# Patient Record
Sex: Male | Born: 1965 | Race: Black or African American | Hispanic: No | Marital: Married | Smoking: Current every day smoker
Health system: Southern US, Community
[De-identification: ages and names within clinical notes are randomized; demographics above are authoritative.]

## PROBLEM LIST (undated history)

## (undated) DIAGNOSIS — M9250 Juvenile osteochondrosis of tibia and fibula, unspecified leg: Secondary | ICD-10-CM

## (undated) DIAGNOSIS — G43909 Migraine, unspecified, not intractable, without status migrainosus: Secondary | ICD-10-CM

## (undated) DIAGNOSIS — M92529 Juvenile osteochondrosis of tibia tubercle, unspecified leg: Secondary | ICD-10-CM

## (undated) DIAGNOSIS — J189 Pneumonia, unspecified organism: Secondary | ICD-10-CM

## (undated) DIAGNOSIS — A6920 Lyme disease, unspecified: Secondary | ICD-10-CM

## (undated) DIAGNOSIS — Z8659 Personal history of other mental and behavioral disorders: Secondary | ICD-10-CM

## (undated) HISTORY — PX: NO PAST SURGERIES: SHX2092

---

## 1993-07-01 DIAGNOSIS — J189 Pneumonia, unspecified organism: Secondary | ICD-10-CM

## 1993-07-01 HISTORY — DX: Pneumonia, unspecified organism: J18.9

## 2000-10-02 ENCOUNTER — Emergency Department (HOSPITAL_COMMUNITY): Admission: EM | Admit: 2000-10-02 | Discharge: 2000-10-02 | Payer: Self-pay | Admitting: *Deleted

## 2001-08-13 ENCOUNTER — Emergency Department (HOSPITAL_COMMUNITY): Admission: EM | Admit: 2001-08-13 | Discharge: 2001-08-13 | Payer: Self-pay | Admitting: *Deleted

## 2001-10-20 ENCOUNTER — Encounter: Payer: Self-pay | Admitting: Emergency Medicine

## 2001-10-20 ENCOUNTER — Emergency Department (HOSPITAL_COMMUNITY): Admission: EM | Admit: 2001-10-20 | Discharge: 2001-10-20 | Payer: Self-pay | Admitting: *Deleted

## 2003-12-28 ENCOUNTER — Emergency Department (HOSPITAL_COMMUNITY): Admission: EM | Admit: 2003-12-28 | Discharge: 2003-12-28 | Payer: Self-pay | Admitting: Emergency Medicine

## 2010-06-15 ENCOUNTER — Ambulatory Visit: Payer: Self-pay | Admitting: Family Medicine

## 2010-06-15 DIAGNOSIS — F17200 Nicotine dependence, unspecified, uncomplicated: Secondary | ICD-10-CM | POA: Insufficient documentation

## 2010-08-02 NOTE — Assessment & Plan Note (Signed)
Summary: UNCLOG EARS/EVM   Vital Signs:  Patient Profile:   45 Years Old Male CC:      Left Ear Ache, Possible wax bulid up. Height:     72 inches Weight:      167 pounds BMI:     22.73 O2 Sat:      100 % O2 treatment:    Room Air Temp:     98.0 degrees F oral Pulse rate:   76 / minute Pulse rhythm:   regular Resp:     18 per minute BP sitting:   130 / 85  (right arm)  Pt. in pain?   yes    Location:   head    Intensity:   2    Type:       aching  Vitals Entered By: Levonne Spiller EMT-P (June 15, 2010 3:00 PM)              Is Patient Diabetic? No  Does patient need assistance? Functional Status Self care Ambulation Normal Comments Pt. is a smoker. 1 half pack per day.      Current Allergies: No known allergies History of Present Illness History from: patient Reason for visit: see chief complaint Chief Complaint: Left Ear Ache, Possible wax bulid up. History of Present Illness: The patient came in today because he says that he has to have his ears cleaned out every few years because of cerumen impaction.  He says that he has tried to clean his ears at home with no luck.  He is having trouble hearing and relies on being able to hear well.  The patient says that he would like to have his ears cleaned.  He says that he is having some sinus drainage and nasal congestion as well. No fever or chills or nausea or vomiting.   REVIEW OF SYSTEMS Constitutional Symptoms      Denies fever, chills, night sweats, weight loss, weight gain, and fatigue.  Eyes       Denies change in vision, eye pain, eye discharge, glasses, contact lenses, and eye surgery. Ear/Nose/Throat/Mouth       Complains of hearing loss/aids, change in hearing, ear pain, and sinus problems.      Denies ear discharge, dizziness, frequent runny nose, frequent nose bleeds, sore throat, hoarseness, and tooth pain or bleeding.  Respiratory       Denies dry cough, productive cough, wheezing, shortness of breath,  asthma, bronchitis, and emphysema/COPD.  Cardiovascular       Denies murmurs, chest pain, and tires easily with exhertion.    Gastrointestinal       Denies stomach pain, nausea/vomiting, diarrhea, constipation, blood in bowel movements, and indigestion. Genitourniary       Denies painful urination, kidney stones, and loss of urinary control. Neurological       Denies paralysis, seizures, and fainting/blackouts. Musculoskeletal       Denies muscle pain, joint pain, joint stiffness, decreased range of motion, redness, swelling, muscle weakness, and gout.  Skin       Denies bruising, unusual mles/lumps or sores, and hair/skin or nail changes.  Psych       Denies mood changes, temper/anger issues, anxiety/stress, speech problems, depression, and sleep problems.  Past History:  Family History: Last updated: 06/15/2010 Mother - Bells Palsy, Ovarian Cancer Father - Cancer Son has narrow ear canals and has to see a specialist every 4 months.   Social History: Last updated: 06/15/2010 Occupation: Herbalist Married Current Smoker Alcohol  use-no Drug use-no They have a son  Risk Factors: Smoking Status: current (06/15/2010)  Past Medical History: Bilateral narrow ear canals  Past Surgical History: Denies surgical history  Family History: Mother - Bells Palsy, Ovarian Cancer Father - Cancer Son has narrow ear canals and has to see a specialist every 4 months.   Social History: Occupation: Herbalist Married Current Smoker Alcohol use-no Drug use-no They have a sonSmoking Status:  current Drug Use:  no Physical Exam General appearance: well developed, well nourished, no acute distress Head: normocephalic, atraumatic Eyes: conjunctivae and lids normal Pupils: equal, round, reactive to light Ears: excessive cerumen bilateral Nasal: swollen red turbinates with congestion, thick yellow discharge Oral/Pharynx: tongue normal, posterior  pharynx without erythema or exudate Neck: neck supple,  trachea midline, no masses Chest/Lungs: no rales, wheezes, or rhonchi bilateral, breath sounds equal without effort Heart: regular rate and  rhythm, no murmur Abdomen: soft, non-tender without obvious organomegaly Extremities: normal extremities Neurological: grossly intact and non-focal Skin: no obvious rashes or lesions MSE: oriented to time, place, and person Assessment Problems:   New Problems: CIGARETTE SMOKER (ICD-305.1) ACUTE SINUSITIS, UNSPECIFIED (ICD-461.9) CERUMEN IMPACTION, BILATERAL (ICD-380.4)   Patient Education: The risks, benefits and possible side effects were clearly explained and discussed with the patient.  The patient verbalized clear understanding.  The patient was given instructions to return if symptoms don't improve, worsen or new changes develop.  If it is not during clinic hours and the patient cannot get back to this clinic then the patient was told to seek medical care at an available urgent care or emergency department.  The patient verbalized understanding.   Demonstrates willingness to comply.  Plan New Medications/Changes: AMOXICILLIN 500 MG CAPS (AMOXICILLIN) take 1 by mouth three times a day  #30 x 0, 06/15/2010, Clanford Johnson MD FLUTICASONE PROPIONATE 50 MCG/ACT SUSP (FLUTICASONE PROPIONATE) 2 sprays per nostril once daily for sinus and allergies  #1 x 0, 06/15/2010, Standley Dakins MD  Planning Comments:   The patient was counseled and advised to stop using all tobacco products.  Medical assistance was offered and the patient was encouraged to call 1-800-QUIT-NOW to get a smoking cessation coach.    Follow Up: Follow up in 2-3 days if no improvement, Follow up on an as needed basis, Follow up with Primary Physician  The patient and/or caregiver has been counseled thoroughly with regard to medications prescribed including dosage, schedule, interactions, rationale for use, and possible side  effects and they verbalize understanding.  Diagnoses and expected course of recovery discussed and will return if not improved as expected or if the condition worsens. Patient and/or caregiver verbalized understanding.  Prescriptions: AMOXICILLIN 500 MG CAPS (AMOXICILLIN) take 1 by mouth three times a day  #30 x 0   Entered and Authorized by:   Standley Dakins MD   Signed by:   Standley Dakins MD on 06/15/2010   Method used:   Electronically to        [facility] [address] (retail)       [address], [address]       [place], [place]  [address]       Ph: [phone]       Fax: [phone]   RxID:   [id number] FLUTICASONE PROPIONATE 50 MCG/ACT SUSP (FLUTICASONE PROPIONATE) 2 sprays per nostril once daily for sinus and allergies  #1 x 0   Entered and Authorized by:   Standley Dakins  MD   Signed by:   Standley Dakins MD on 06/15/2010   Method used:   Electronically to        [facility] [address] (retail)       [address], [address]       [place], [place]  [address]       Ph: [phone]       Fax: [phone]   RxID:   [id number]   Patient Instructions: 1)  Tobacco is very bad for your health and your loved ones! You Should stop smoking!. 2)  Stop Smoking Tips: Choose a Quit date. Cut down before the Quit date. decide what you will do as a substitute when you feel the urge to smoke(gum,toothpick,exercise). 3)  Take your antibiotic as prescribed until ALL of it is gone, but stop if you develop a rash or swelling and contact our office as soon as possible. 4)  Acute sinusitis symptoms for less than 10 days are not helped by antibiotics.Use warm moist compresses, and over the counter decongestants ( only as directed). Call if no improvement in 5-7 days, sooner if increasing pain, fever, or new symptoms. 5)  The patient was informed that there is no on-call Kaige Whistler or services available at this clinic during  off-hours (when the clinic is closed).  If the patient developed a problem or concern that required immediate attention, the patient was advised to go the the nearest available urgent care or emergency department for medical care.  The patient verbalized understanding.

## 2011-10-29 ENCOUNTER — Emergency Department (HOSPITAL_BASED_OUTPATIENT_CLINIC_OR_DEPARTMENT_OTHER)
Admission: EM | Admit: 2011-10-29 | Discharge: 2011-10-30 | Disposition: A | Discharge status: Home / Self Care | Payer: Self-pay | Attending: Emergency Medicine | Admitting: Emergency Medicine

## 2011-10-29 ENCOUNTER — Emergency Department (INDEPENDENT_AMBULATORY_CARE_PROVIDER_SITE_OTHER): Payer: Self-pay

## 2011-10-29 ENCOUNTER — Encounter (HOSPITAL_BASED_OUTPATIENT_CLINIC_OR_DEPARTMENT_OTHER): Payer: Self-pay | Admitting: Emergency Medicine

## 2011-10-29 DIAGNOSIS — M25476 Effusion, unspecified foot: Secondary | ICD-10-CM | POA: Insufficient documentation

## 2011-10-29 DIAGNOSIS — S82899A Other fracture of unspecified lower leg, initial encounter for closed fracture: Secondary | ICD-10-CM | POA: Insufficient documentation

## 2011-10-29 DIAGNOSIS — Y9239 Other specified sports and athletic area as the place of occurrence of the external cause: Secondary | ICD-10-CM | POA: Insufficient documentation

## 2011-10-29 DIAGNOSIS — X58XXXA Exposure to other specified factors, initial encounter: Secondary | ICD-10-CM

## 2011-10-29 DIAGNOSIS — Z09 Encounter for follow-up examination after completed treatment for conditions other than malignant neoplasm: Secondary | ICD-10-CM

## 2011-10-29 DIAGNOSIS — S82301A Unspecified fracture of lower end of right tibia, initial encounter for closed fracture: Secondary | ICD-10-CM

## 2011-10-29 DIAGNOSIS — S8990XA Unspecified injury of unspecified lower leg, initial encounter: Secondary | ICD-10-CM | POA: Insufficient documentation

## 2011-10-29 DIAGNOSIS — M25579 Pain in unspecified ankle and joints of unspecified foot: Secondary | ICD-10-CM | POA: Insufficient documentation

## 2011-10-29 DIAGNOSIS — W010XXA Fall on same level from slipping, tripping and stumbling without subsequent striking against object, initial encounter: Secondary | ICD-10-CM | POA: Insufficient documentation

## 2011-10-29 DIAGNOSIS — M25473 Effusion, unspecified ankle: Secondary | ICD-10-CM | POA: Insufficient documentation

## 2011-10-29 DIAGNOSIS — Y9364 Activity, baseball: Secondary | ICD-10-CM | POA: Insufficient documentation

## 2011-10-29 MED ORDER — MIDAZOLAM HCL 5 MG/5ML IJ SOLN
INTRAMUSCULAR | Status: AC
  Filled 2011-10-29: qty 5

## 2011-10-29 MED ORDER — MIDAZOLAM HCL 2 MG/2ML IJ SOLN: 4.0000 mg | Freq: Once | INTRAMUSCULAR | Status: DC

## 2011-10-29 MED ORDER — ETOMIDATE 2 MG/ML IV SOLN: 10.0000 mg | Freq: Once | INTRAVENOUS | Status: DC

## 2011-10-29 MED ORDER — OXYCODONE-ACETAMINOPHEN 5-325 MG PO TABS: ORAL_TABLET | ORAL | Status: DC

## 2011-10-29 MED ORDER — MIDAZOLAM HCL 2 MG/2ML IJ SOLN
2.0000 mg | Freq: Once | INTRAMUSCULAR | Status: AC
  Administered 2011-10-29: 2 mg via INTRAVENOUS

## 2011-10-29 MED ORDER — HYDROMORPHONE HCL PF 1 MG/ML IJ SOLN
0.5000 mg | Freq: Once | INTRAMUSCULAR | Status: AC
  Administered 2011-10-29: 0.5 mg via INTRAVENOUS
  Filled 2011-10-29: qty 1

## 2011-10-29 MED ORDER — ETOMIDATE 2 MG/ML IV SOLN
8.0000 mg | Freq: Once | INTRAVENOUS | Status: AC
  Administered 2011-10-29: 8 mg via INTRAVENOUS

## 2011-10-29 MED ORDER — ETOMIDATE 2 MG/ML IV SOLN
INTRAVENOUS | Status: AC
  Filled 2011-10-29: qty 10

## 2011-10-29 MED ORDER — HYDROMORPHONE HCL PF 1 MG/ML IJ SOLN
INTRAMUSCULAR | Status: AC
  Administered 2011-10-29: 1 mg
  Filled 2011-10-29: qty 1

## 2011-10-29 NOTE — ED Notes (Signed)
Pt was placed on [place] 2 lpm for conscious sedation procedure of his right ankle. Pt tolerated well and SPO2 and other vitals remained stable.

## 2011-10-29 NOTE — Discharge Instructions (Signed)
Narcotic and benzodiazepine use may cause drowsiness, slowed breathing or dependence.  Please use with caution and do not drive, operate machinery or watch young children alone while taking them.  Taking combinations of these medications or drinking alcohol will potentiate these effects.    

## 2011-10-29 NOTE — ED Notes (Signed)
inj to left ankle/foot  slidding into base

## 2011-10-29 NOTE — ED Provider Notes (Signed)
History     CSN: [id number]  Arrival date & time 10/29/11  2006   First MD Initiated Contact with Patient 10/29/11 2015      Chief Complaint  Patient presents with  . Foot Injury    (Consider location/radiation/quality/duration/timing/severity/associated sxs/prior treatment) HPI Comments: Pt was playing softball and slid into third base with immediate pain and deformity to lower right leg at ankle with obvious deformity.  No laceration or blood or abrasion seen.  Pt denies pain at knee, hip, thigh.  Level 5 caveat due to emergent need for intervention and severe pain.  No SOB, nausea.  Pt last ate yesterday.  No sig PMH.    The history is provided by the patient and a friend.    History reviewed. No pertinent past medical history.  History reviewed. No pertinent past surgical history.  History reviewed. No pertinent family history.  History  Substance Use Topics  . Smoking status: Not on file  . Smokeless tobacco: Not on file  . Alcohol Use: Yes      Review of Systems  Unable to perform ROS: Other    Allergies  Review of patient's allergies indicates no known allergies.  Home Medications   Current Outpatient Rx  Name Route Sig Dispense Refill  . OXYCODONE-ACETAMINOPHEN 5-325 MG PO TABS  1-2 tablets po q 6 hours prn moderate to severe pain 30 tablet 0    BP 100/70  Pulse 74  Temp(Src) 97 F (36.1 C) (Oral)  Resp 20  SpO2 99%  Physical Exam  Nursing note and vitals reviewed. Constitutional: He is oriented to person, place, and time. He appears well-developed and well-nourished.  HENT:  Head: Normocephalic and atraumatic.  Mouth/Throat: Uvula is midline and oropharynx is clear and moist.  Eyes: Pupils are equal, round, and reactive to light.  Neck: Normal range of motion. Neck supple.  Cardiovascular: Normal rate and regular rhythm.   Pulmonary/Chest: Effort normal. He has no wheezes. He has no rales.  Musculoskeletal:       Right ankle: He exhibits  decreased range of motion, swelling and deformity. He exhibits no laceration and normal pulse. No proximal fibula tenderness found.       Compartments are soft  Neurological: He is alert and oriented to person, place, and time. No sensory deficit. GCS eye subscore is 4. GCS verbal subscore is 5. GCS motor subscore is 6.  Skin: Skin is warm, dry and intact. No rash noted. He is not diaphoretic. No pallor.    ED Course  Reduction of fracture Date/Time: 10/29/2011 9:08 PM Performed by: Lear Ng. Authorized by: Lear Ng Consent: Verbal consent obtained. Written consent obtained. Risks and benefits: risks, benefits and alternatives were discussed Consent given by: patient and spouse Patient understanding: patient states understanding of the procedure being performed Site marked: the operative site was not marked Imaging studies: imaging studies available Patient identity confirmed: verbally with patient and arm band Time out: Immediately prior to procedure a "time out" was called to verify the correct patient, procedure, equipment, support staff and site/side marked as required. Patient sedated: yes Sedation type: moderate (conscious) sedation Sedatives: etomidate and midazolam Analgesia: hydromorphone Sedation start date/time: 10/29/2011 8:57 PM Sedation end date/time: 10/29/2011 9:09 PM Vitals: Vital signs were monitored during sedation. Patient tolerance: Patient tolerated the procedure well with no immediate complications. Comments: Reduction of distal right tib fib fracture.  I was responsible for splinting assisted by technician.  Cap refill remains at <2 sec.  SPLINT APPLICATION Date/Time: 10/29/2011 9:11 PM Performed by: Lear Ng. Authorized by: Lear Ng Consent: Verbal consent obtained. Consent given by: patient Patient understanding: patient states understanding of the procedure being performed Patient identity confirmed: arm band Time out:  Immediately prior to procedure a "time out" was called to verify the correct patient, procedure, equipment, support staff and site/side marked as required. Location details: right ankle Post-procedure: The splinted body part was neurovascularly unchanged following the procedure. Patient tolerance: Patient tolerated the procedure well with no immediate complications.   (including critical care time)  Labs Reviewed - No data to display Dg Ankle Right Port  10/29/2011  *RADIOLOGY REPORT*  Clinical Data: Post reduction right ankle fracture.  PORTABLE RIGHT ANKLE - 2 VIEW  Comparison: 10/29/2011  Findings: Interval improved alignment of the complex distal tibia and fibular fractures. There is some residual lateral displacement and angulation. Nondisplaced medial malleolus fracture. Detailed osseous evaluation is obscured by overlying cast artifact.  IMPRESSION: Post reduction distal tibia/fibular fractures.  Improved alignment with mild residual lateral displacement and angulation.  Original Report Authenticated By: Waneta Martins, M.D.   Dg Ankle Right Port  10/29/2011  *RADIOLOGY REPORT*  Clinical Data: Softball injury, now with obvious deformity  PORTABLE RIGHT ANKLE - 2 VIEW  Comparison: None.  Findings:  There is a complete, oblique fracture of the distal tibial and adjacent distal fibular diaphysis with foreshortening of the fracture fragments and angulation, apex anterior and lateral.  The ankle mortise appears preserved on the provided AP radiograph. There is expected adjacent soft tissue swelling.  No definite radiopaque foreign body.  IMPRESSION: Complete, oblique fractures of the distal diaphysis of the tibia and fibula with angulation and foreshortening.  Ankle mortise appears preserved on solitary provided AP radiograph.  Original Report Authenticated By: Waynard Reeds, M.D.     1. Closed fracture of distal end of fibula with tibia, right, initial encounter       MDM  I reviewed  initial film.  Sedation with reduction is recommended to pt who verbally agrees, signs consent as above.  please see procedure notes.  Pt tolerated well.  Post reduction film also obtained.  Will refer to ortho for follow up.     Discussed with Dr. Ave Filter who reviewd films, will see pt tomorrow.  Discussed with pt and family.          Gavin Pound. Oletta Lamas, MD 10/29/11 2149

## 2011-10-30 ENCOUNTER — Emergency Department (HOSPITAL_COMMUNITY): Payer: Medicaid Other

## 2011-10-30 ENCOUNTER — Inpatient Hospital Stay (HOSPITAL_COMMUNITY)
Admission: EM | Admit: 2011-10-30 | Discharge: 2011-11-01 | DRG: 494 | Disposition: A | Discharge status: Home / Self Care | Payer: Medicaid Other | Attending: Orthopedic Surgery | Admitting: Orthopedic Surgery

## 2011-10-30 ENCOUNTER — Encounter (HOSPITAL_COMMUNITY): Payer: Self-pay | Admitting: *Deleted

## 2011-10-30 ENCOUNTER — Inpatient Hospital Stay (HOSPITAL_COMMUNITY): Payer: Medicaid Other

## 2011-10-30 DIAGNOSIS — S82899A Other fracture of unspecified lower leg, initial encounter for closed fracture: Principal | ICD-10-CM | POA: Diagnosis present

## 2011-10-30 DIAGNOSIS — Y9364 Activity, baseball: Secondary | ICD-10-CM

## 2011-10-30 DIAGNOSIS — S82201A Unspecified fracture of shaft of right tibia, initial encounter for closed fracture: Secondary | ICD-10-CM

## 2011-10-30 DIAGNOSIS — Y92838 Other recreation area as the place of occurrence of the external cause: Secondary | ICD-10-CM

## 2011-10-30 DIAGNOSIS — M928 Other specified juvenile osteochondrosis: Secondary | ICD-10-CM | POA: Diagnosis present

## 2011-10-30 DIAGNOSIS — Y9239 Other specified sports and athletic area as the place of occurrence of the external cause: Secondary | ICD-10-CM

## 2011-10-30 DIAGNOSIS — Y998 Other external cause status: Secondary | ICD-10-CM

## 2011-10-30 DIAGNOSIS — G43909 Migraine, unspecified, not intractable, without status migrainosus: Secondary | ICD-10-CM

## 2011-10-30 DIAGNOSIS — F17200 Nicotine dependence, unspecified, uncomplicated: Secondary | ICD-10-CM | POA: Diagnosis present

## 2011-10-30 HISTORY — DX: Migraine, unspecified, not intractable, without status migrainosus: G43.909

## 2011-10-30 HISTORY — DX: Pneumonia, unspecified organism: J18.9

## 2011-10-30 HISTORY — DX: Juvenile osteochondrosis of tibia tubercle, unspecified leg: M92.529

## 2011-10-30 HISTORY — DX: Personal history of other mental and behavioral disorders: Z86.59

## 2011-10-30 HISTORY — DX: Juvenile osteochondrosis of tibia and fibula, unspecified leg: M92.50

## 2011-10-30 LAB — CBC
HCT: 36.5 % — ABNORMAL LOW (ref 39.0–52.0)
MCHC: 33.4 g/dL (ref 30.0–36.0)
MCV: 90.6 fL (ref 78.0–100.0)
Platelets: 176 10*3/uL (ref 150–400)
RDW: 12.9 % (ref 11.5–15.5)
WBC: 6.6 10*3/uL (ref 4.0–10.5)

## 2011-10-30 LAB — BASIC METABOLIC PANEL
BUN: 8 mg/dL (ref 6–23)
Calcium: 8.4 mg/dL (ref 8.4–10.5)
Chloride: 104 mEq/L (ref 96–112)
Creatinine, Ser: 1.12 mg/dL (ref 0.50–1.35)
GFR calc Af Amer: >90 mL/min (ref >90)

## 2011-10-30 LAB — ABO/RH: ABO/RH(D): O POS

## 2011-10-30 LAB — PROTIME-INR: Prothrombin Time: 14.5 seconds (ref 11.6–15.2)

## 2011-10-30 LAB — TYPE AND SCREEN

## 2011-10-30 MED ORDER — ALUM & MAG HYDROXIDE-SIMETH 200-200-20 MG/5ML PO SUSP: 30.0000 mL | Freq: Four times a day (QID) | ORAL | Status: DC | PRN

## 2011-10-30 MED ORDER — MIDAZOLAM HCL 2 MG/2ML IJ SOLN: 1.0000 mg | INTRAMUSCULAR | Status: DC | PRN

## 2011-10-30 MED ORDER — MORPHINE SULFATE 4 MG/ML IJ SOLN
4.0000 mg | INTRAMUSCULAR | Status: DC | PRN
  Administered 2011-10-30: 4 mg via INTRAVENOUS
  Filled 2011-10-30: qty 1

## 2011-10-30 MED ORDER — HYDROMORPHONE HCL 1 MG/ML PO LIQD: 1.0000 mg | Freq: Once | ORAL | Status: DC

## 2011-10-30 MED ORDER — HYDROMORPHONE HCL PF 1 MG/ML IJ SOLN
1.0000 mg | Freq: Once | INTRAMUSCULAR | Status: AC
  Administered 2011-10-30: 1 mg via INTRAVENOUS
  Filled 2011-10-30: qty 1

## 2011-10-30 MED ORDER — SODIUM CHLORIDE 0.9 % IV SOLN
INTRAVENOUS | Status: DC
  Administered 2011-10-30: 100 mL/h via INTRAVENOUS

## 2011-10-30 MED ORDER — DOCUSATE SODIUM 100 MG PO CAPS
100.0000 mg | ORAL_CAPSULE | Freq: Two times a day (BID) | ORAL | Status: DC
  Administered 2011-10-30 – 2011-11-01 (×4): 100 mg via ORAL
  Filled 2011-10-30 (×7): qty 1

## 2011-10-30 MED ORDER — FENTANYL CITRATE 0.05 MG/ML IJ SOLN: 50.0000 ug | INTRAMUSCULAR | Status: DC | PRN

## 2011-10-30 MED ORDER — ONDANSETRON HCL 4 MG/2ML IJ SOLN: 4.0000 mg | Freq: Four times a day (QID) | INTRAMUSCULAR | Status: DC | PRN

## 2011-10-30 MED ORDER — ONDANSETRON HCL 4 MG/2ML IJ SOLN
4.0000 mg | INTRAMUSCULAR | Status: AC | PRN
  Administered 2011-10-30 (×2): 4 mg via INTRAVENOUS
  Filled 2011-10-30 (×2): qty 2

## 2011-10-30 MED ORDER — MORPHINE SULFATE 2 MG/ML IJ SOLN
2.0000 mg | INTRAMUSCULAR | Status: DC | PRN
  Administered 2011-10-30 – 2011-11-01 (×9): 2 mg via INTRAVENOUS
  Filled 2011-10-30 (×9): qty 1

## 2011-10-30 MED ORDER — HYDROMORPHONE HCL PF 1 MG/ML IJ SOLN
1.0000 mg | INTRAMUSCULAR | Status: AC | PRN
  Administered 2011-10-30 (×2): 1 mg via INTRAVENOUS
  Filled 2011-10-30 (×2): qty 1

## 2011-10-30 MED ORDER — KCL IN DEXTROSE-NACL 20-5-0.45 MEQ/L-%-% IV SOLN
INTRAVENOUS | Status: DC
  Administered 2011-10-31: 01:00:00 via INTRAVENOUS
  Filled 2011-10-30 (×6): qty 1000

## 2011-10-30 MED ORDER — OXYCODONE HCL 5 MG PO TABS
5.0000 mg | ORAL_TABLET | ORAL | Status: DC | PRN
  Administered 2011-10-30 – 2011-11-01 (×5): 5 mg via ORAL
  Filled 2011-10-30 (×5): qty 1

## 2011-10-30 MED ORDER — ACETAMINOPHEN 650 MG RE SUPP: 650.0000 mg | Freq: Four times a day (QID) | RECTAL | Status: DC | PRN

## 2011-10-30 MED ORDER — ONDANSETRON HCL 4 MG PO TABS: 4.0000 mg | ORAL_TABLET | Freq: Four times a day (QID) | ORAL | Status: DC | PRN

## 2011-10-30 MED ORDER — ZOLPIDEM TARTRATE 5 MG PO TABS: 5.0000 mg | ORAL_TABLET | Freq: Every evening | ORAL | Status: DC | PRN

## 2011-10-30 MED ORDER — ACETAMINOPHEN 325 MG PO TABS
650.0000 mg | ORAL_TABLET | Freq: Four times a day (QID) | ORAL | Status: DC | PRN
  Administered 2011-11-01 (×2): 650 mg via ORAL
  Filled 2011-10-30 (×2): qty 2

## 2011-10-30 MED ORDER — PNEUMOCOCCAL VAC POLYVALENT 25 MCG/0.5ML IJ INJ
0.5000 mL | INJECTION | INTRAMUSCULAR | Status: AC
  Filled 2011-10-30: qty 0.5

## 2011-10-30 NOTE — ED Provider Notes (Signed)
History     CSN: [id number]  Arrival date & time 10/30/11  0224   First MD Initiated Contact with Patient 10/30/11 [phone]      Chief Complaint  Patient presents with  . Leg Pain    HPI Pt was seen at 0335.  Per pt and spouse, c/o gradual onset and worsening of constant RLE "pain" that began last night.  Pt states the pain began after he "slid into 3rd base" playing softball last night.  Pt was eval at [facility] after the injury, dx tibia-fibula fracture, reduced/splinted in the ED, rx percocet for pain, and scheduled a f/u appt with Ortho Dr. Ave Filter for today at 0900.  Pt states he took 2 tabs of percocet for pain, then began to have several episodes of N/V.  Has been unable to tol his pain meds and is c/o increasing pain RLE.  Denies new injury, no fevers, no tingling/numbness in extremity, no focal motor weakness.     History reviewed. No pertinent past medical history.  History reviewed. No pertinent past surgical history.   History  Substance Use Topics  . Smoking status: Current Everyday Smoker -- 1.0 packs/day  . Smokeless tobacco: Not on file  . Alcohol Use: Yes    Review of Systems ROS: Statement: All systems negative except as marked or noted in the HPI; Constitutional: Negative for fever and chills. ; ; Eyes: Negative for eye pain, redness and discharge. ; ; ENMT: Negative for ear pain, hoarseness, nasal congestion, sinus pressure and sore throat. ; ; Cardiovascular: Negative for chest pain, palpitations, diaphoresis, dyspnea and peripheral edema. ; ; Respiratory: Negative for cough, wheezing and stridor. ; ; Gastrointestinal: +N/V. Negative for diarrhea, abdominal pain, blood in stool, hematemesis, jaundice and rectal bleeding. . ; ; Genitourinary: Negative for dysuria, flank pain and hematuria. ; ; Musculoskeletal: +RLE pain. Negative for back pain and neck pain. Negative for new trauma.; ; Skin: Negative for pruritus, rash, abrasions, blisters, bruising and skin lesion.; ; Neuro:  Negative for headache, lightheadedness and neck stiffness. Negative for weakness, altered level of consciousness , altered mental status, extremity weakness, paresthesias, involuntary movement, seizure and syncope.      Allergies  Review of patient's allergies indicates no known allergies.  Home Medications   Current Outpatient Rx  Name Route Sig Dispense Refill  . OXYCODONE-ACETAMINOPHEN 5-325 MG PO TABS Oral Take 1-2 tablets by mouth every 6 (six) hours as needed. For pain      BP 112/65  Pulse 67  Temp(Src) 97.6 F (36.4 C) (Oral)  Resp 16  SpO2 96%  Physical Exam 0340: Physical examination:  Nursing notes reviewed; Vital signs and O2 SAT reviewed;  Constitutional: Well developed, Well nourished, Well hydrated, Uncomfortable appearing; Head:  Normocephalic, atraumatic; Eyes: EOMI, PERRL, No scleral icterus; ENMT: Mouth and pharynx normal, Mucous membranes moist; Neck: Supple, Full range of motion, No lymphadenopathy; Cardiovascular: Regular rate and rhythm, No murmur, rub, or gallop; Respiratory: Breath sounds clear & equal bilaterally, No rales, rhonchi, wheezes, or rub, Normal respiratory effort/excursion; Chest: Nontender, Movement normal; Abdomen: Soft, Nontender, Nondistended, Normal bowel sounds;  Extremities: Pulses normal, +RLE in post splint with brisk cap refill in toes.  Toes are warm/dry/good color with active and passive ROM without increasing pain.; Neuro: AA&Ox3, Major CN grossly intact.  No gross focal motor or sensory deficits in extremities.; Skin: Color normal, Warm, Dry.   ED Course  Procedures  0345:  Unable to take his pain meds due to N/V.  Will tx  for pain and nausea here.    0600:  Pt continues to c/o returning "excrutiating" pain RLE after IV dilaudid dose wears off.  Post splint in place, pt continues able to move his toes without increasing pain, toes continue with brisk cap refill, W/D/good color, no pain on palpation of compartments/soft.  Doubt  compartment syndrome at this time.  Repeat XR largely unchanged from previous last evening.    0620:  T/C to Ortho Dr. Ave Filter, case discussed, including:  HPI, pertinent PM/SHx, VS/PE, dx testing, ED course and treatment:  Agreeable to admit, he will come to the ED for eval.  Dx testing d/w pt and family.  Questions answered.  Verb understanding, agreeable to admit.   MDM  MDM Reviewed: nursing note, previous chart and vitals Reviewed previous: x-ray    Dg Tibia/fibula Right 10/30/2011  *RADIOLOGY REPORT*  Clinical Data: Severe leg pain after splinting leg fracture.  RIGHT TIBIA AND FIBULA - 2 VIEW  Comparison: 10/29/2011  Findings: Cast artifact obscures detailed osseous evaluation. Complex distal tibia and fibular fractures with medial displacement and angulation, similar to prior.  Nondisplaced medial malleolar fracture again suggested.  Ankle mortise appears congruent. No definite proximal fibular fracture however the evaluation is obscured by overlying cast artifact.  IMPRESSION: Complex distal tibia and fibular fractures in similar alignment to prior.  No definite additional fracture however cast artifact obscures osseous detail.  If clinical concern for a proximal injury persists, recommend repeat radiograph without cast.  Original Report Authenticated By: Waneta Martins, M.D.   Dg Ankle Right Port 10/29/2011  *RADIOLOGY REPORT*  Clinical Data: Post reduction right ankle fracture.  PORTABLE RIGHT ANKLE - 2 VIEW  Comparison: 10/29/2011  Findings: Interval improved alignment of the complex distal tibia and fibular fractures. There is some residual lateral displacement and angulation. Nondisplaced medial malleolus fracture. Detailed osseous evaluation is obscured by overlying cast artifact.  IMPRESSION: Post reduction distal tibia/fibular fractures.  Improved alignment with mild residual lateral displacement and angulation.  Original Report Authenticated By: Waneta Martins, M.D.   Dg  Ankle Right Port 10/29/2011  *RADIOLOGY REPORT*  Clinical Data: Softball injury, now with obvious deformity  PORTABLE RIGHT ANKLE - 2 VIEW  Comparison: None.  Findings:  There is a complete, oblique fracture of the distal tibial and adjacent distal fibular diaphysis with foreshortening of the fracture fragments and angulation, apex anterior and lateral.  The ankle mortise appears preserved on the provided AP radiograph. There is expected adjacent soft tissue swelling.  No definite radiopaque foreign body.  IMPRESSION: Complete, oblique fractures of the distal diaphysis of the tibia and fibula with angulation and foreshortening.  Ankle mortise appears preserved on solitary provided AP radiograph.  Original Report Authenticated By: Waynard Reeds, M.D.         Laray Anger, DO 10/31/11 1422

## 2011-10-30 NOTE — ED Notes (Signed)
First meeting with patient. Patient resting and wife at bedside. Patient states he injured his right ankle yesterday and was seen at [facility] and discharged home with appointment to see ortho doctor today. Patient states pain became worse throughout the night and he was nauseated and vomited x 1 from the pain medication and pain. Patient states pain is better right now. Toes are warm and dry to touch with appropriate skin color on right foot. NAD at this time.

## 2011-10-30 NOTE — ED Notes (Signed)
Received call from patients wife stating that patient is in excrutiating pain, took pain medication as prescribed 30 minutes ago and pain is not being relieved. She is requesting him be seen by orthopeadic MD tonight. Explained to patient's wife that we are not able to provide that service at this location. She was informed that she is welcome to bring patient back for pain control but if she wants him seen by ortho MD then she would have to go to either [facility] or [facility] or to the closest hospital. Wife in agreement and stated that if his pain has not improved within 15 min she was planning to take him to [facility]

## 2011-10-30 NOTE — ED Notes (Signed)
Conscious sedation:                                                                                                                                                         Staff present: Judge Stall ; Cheri Guppy.Resp Therapist; [place],EMT;Dr.Ghim                                                        2032 pt placed on monitor,O2/[place] at 2L,pulse ox                                                                                                           2035 consent signed                                                                                                                                                       2038 1000cc NSS hung  2040 dilaudid 1mg  iv  Given                                                                                                                                            2042positive pedal pulse prior to procedure                                                                                                                   2046 time out                                                                                                                                                                   2047 steward scale of 6                                                                                                                                                            Versed 2mg  IV  2048 etomidate 8mg  IV                                                                                                                                                   2049 procedure finished.    Continues to have a good pedal pulse                                                                                 2054 143/76  85  100%  18                                                                                                                                             2057 posterior splint applied per Dr Oletta Lamas and EMT  136/74  87  100%  16  Good pedal pulse remains after splint              Applied                                                                                                                                                                   2100 131//71  84  100%  14  2105 responsive  But falls back to sleep                                                                                                                         2115  129/70  83  100%  14                                                                                                                                            2130  More alert but drowsy  137/83  85  100%  16  O2 off                                                                                            2140 totally awake   Steward scale 6                                                                                                                              2145  130/78  75  100%  16  Remains on room air  Tolerating po flds and crackers.  Using phone  carring on                      Conversation with visitors  2200  122/68  63  100%  16  Voided 450cc  Continues to have a good pedal pulse                                                                 Dilaudid 0.5mg  IV  Ice packs given to go home crutches given to take home  Taken to car via w/c

## 2011-10-30 NOTE — ED Notes (Signed)
Pt seen at [facility] for right broke tib/fib earlier this evening.  States set fracture and needed appt for surgery.  Took percocet at home, vomited x 3 after medication.

## 2011-10-30 NOTE — Anesthesia Preprocedure Evaluation (Addendum)
Anesthesia Evaluation  Patient identified by MRN, date of birth, ID band Patient awake    Reviewed: Allergy & Precautions, H&P , NPO status , Patient's Chart, lab work & pertinent test results  Airway Mallampati: I TM Distance: >3 FB Neck ROM: Full    Dental  (+) Teeth Intact and Dental Advisory Given   Pulmonary          Cardiovascular Rhythm:Regular Rate:Normal     Neuro/Psych    GI/Hepatic   Endo/Other    Renal/GU      Musculoskeletal   Abdominal   Peds  Hematology   Anesthesia Other Findings   Reproductive/Obstetrics                           Anesthesia Physical Anesthesia Plan  ASA: I  Anesthesia Plan: General   Post-op Pain Management:    Induction: Intravenous  Airway Management Planned: LMA  Additional Equipment:   Intra-op Plan:   Post-operative Plan: Extubation in OR  Informed Consent: I have reviewed the patients History and Physical, chart, labs and discussed the procedure including the risks, benefits and alternatives for the proposed anesthesia with the patient or authorized representative who has indicated his/her understanding and acceptance.   Dental advisory given  Plan Discussed with: CRNA, Anesthesiologist and Surgeon  Anesthesia Plan Comments:         Anesthesia Quick Evaluation  

## 2011-10-30 NOTE — H&P (Signed)
Joseph Zhang is an 46 y.o. male.   Chief Complaint: R leg pain HPI: The patient is a 46 year old male who had an injury last night playing softball. He slid into first base and felt a sudden pain in his right lower extremity. He was initially seen at [facility] where x-rays revealed a distal tibia and fibula fracture. I was called last night. The ER physician and are to reduce the ankle and placed in a splint. At that point the reduction was adequate and the patient was discharged with a plan for followup today in my office. He presented early this morning to [facility] due to uncontrolled pain. Since he's been here he has had 2 mg of IV Dilaudid.  When I walked into his room this morning he was sleeping comfortably. I had to wake him from sleep. He calmly told me the story about the baseball injury. He appeared fairly comfortable. However he endorsed pain of 10/10.  History reviewed. No pertinent past medical history.  History reviewed. No pertinent past surgical history.  History reviewed. No pertinent family history. Social History:  reports that he has been smoking.  He does not have any smokeless tobacco history on file. He reports that he drinks alcohol. He reports that he does not use illicit drugs.  Allergies: No Known Allergies   No results found for this or any previous visit (from the past 48 hour(s)). Dg Tibia/fibula Right  10/30/2011  *RADIOLOGY REPORT*  Clinical Data: Severe leg pain after splinting leg fracture.  RIGHT TIBIA AND FIBULA - 2 VIEW  Comparison: 10/29/2011  Findings: Cast artifact obscures detailed osseous evaluation. Complex distal tibia and fibular fractures with medial displacement and angulation, similar to prior.  Nondisplaced medial malleolar fracture again suggested.  Ankle mortise appears congruent. No definite proximal fibular fracture however the evaluation is obscured by overlying cast artifact.  IMPRESSION: Complex distal tibia  and fibular fractures in similar alignment to prior.  No definite additional fracture however cast artifact obscures osseous detail.  If clinical concern for a proximal injury persists, recommend repeat radiograph without cast.  Original Report Authenticated By: Waneta Martins, M.D.   Dg Ankle Right Port  10/29/2011  *RADIOLOGY REPORT*  Clinical Data: Post reduction right ankle fracture.  PORTABLE RIGHT ANKLE - 2 VIEW  Comparison: 10/29/2011  Findings: Interval improved alignment of the complex distal tibia and fibular fractures. There is some residual lateral displacement and angulation. Nondisplaced medial malleolus fracture. Detailed osseous evaluation is obscured by overlying cast artifact.  IMPRESSION: Post reduction distal tibia/fibular fractures.  Improved alignment with mild residual lateral displacement and angulation.  Original Report Authenticated By: Waneta Martins, M.D.   Dg Ankle Right Port  10/29/2011  *RADIOLOGY REPORT*  Clinical Data: Softball injury, now with obvious deformity  PORTABLE RIGHT ANKLE - 2 VIEW  Comparison: None.  Findings:  There is a complete, oblique fracture of the distal tibial and adjacent distal fibular diaphysis with foreshortening of the fracture fragments and angulation, apex anterior and lateral.  The ankle mortise appears preserved on the provided AP radiograph. There is expected adjacent soft tissue swelling.  No definite radiopaque foreign body.  IMPRESSION: Complete, oblique fractures of the distal diaphysis of the tibia and fibula with angulation and foreshortening.  Ankle mortise appears preserved on solitary provided AP radiograph.  Original Report Authenticated By: Waynard Reeds, M.D.    Review of Systems  All other systems reviewed and are negative.  Blood pressure 121/73, pulse 71, temperature 98.3 F (36.8 C), temperature source Oral, resp. rate 18, SpO2 95.00%. Physical Exam  Constitutional: He is oriented to person, place, and time.  He appears well-developed and well-nourished.  HENT:  Head: Atraumatic.  Eyes: EOM are normal.  Cardiovascular: Intact distal pulses.   Respiratory: Effort normal.  GI: Soft.  Musculoskeletal:       The patient was sleeping comfortably when I came in and had to wake him up. The right lower extremity is in a posterior splint which is well fitting. Compartments are soft. No pain with passive stretch of toes. No knee TTP or swelling. Skin intact as examined.  Neurological: He is alert and oriented to person, place, and time.  Skin: Skin is warm and dry.  Psychiatric: He has a normal mood and affect.     Assessment/Plan Right distal tibia and fibula fracture. No evidence for compartment syndrome. I had a long discussion with the patient regarding his diagnosis and treatment options. He will do better with surgical management to promote anatomic alignment and early mobilization. Plan will be for plate fixation tomorrow. He will be admitted for observation elevation and pain control. He has no evidence for compartment syndrome at this time but will be monitored. All questions were welcomed and answered. He will be n.p.o. after midnight for surgery tomorrow. I also stressed the importance of smoking cessation for fracture healing.  [name] 10/30/2011, 6:55 AM

## 2011-10-30 NOTE — ED Notes (Signed)
Received pt. From triage via w/c,

## 2011-10-31 ENCOUNTER — Encounter (HOSPITAL_COMMUNITY): Payer: Self-pay | Admitting: Anesthesiology

## 2011-10-31 ENCOUNTER — Inpatient Hospital Stay (HOSPITAL_COMMUNITY): Payer: Medicaid Other | Admitting: Anesthesiology

## 2011-10-31 ENCOUNTER — Encounter (HOSPITAL_COMMUNITY)
Admission: EM | Disposition: A | Discharge status: Home / Self Care | Payer: Self-pay | Source: Home / Self Care | Attending: Orthopedic Surgery

## 2011-10-31 ENCOUNTER — Inpatient Hospital Stay (HOSPITAL_COMMUNITY): Payer: Medicaid Other

## 2011-10-31 HISTORY — PX: ORIF ANKLE FRACTURE: SHX5408

## 2011-10-31 SURGERY — OPEN REDUCTION INTERNAL FIXATION (ORIF) ANKLE FRACTURE
Anesthesia: General | Site: Ankle | Laterality: Right | Wound class: Clean

## 2011-10-31 MED ORDER — ONDANSETRON HCL 4 MG/2ML IJ SOLN: 4.0000 mg | Freq: Once | INTRAMUSCULAR | Status: AC | PRN

## 2011-10-31 MED ORDER — CEFAZOLIN SODIUM 1-5 GM-% IV SOLN
INTRAVENOUS | Status: DC | PRN
  Administered 2011-10-31: 1 g via INTRAVENOUS

## 2011-10-31 MED ORDER — MUPIROCIN 2 % EX OINT
1.0000 "application " | TOPICAL_OINTMENT | Freq: Two times a day (BID) | CUTANEOUS | Status: DC
  Administered 2011-10-31 – 2011-11-01 (×3): 1 via NASAL
  Filled 2011-10-31: qty 22

## 2011-10-31 MED ORDER — KCL IN DEXTROSE-NACL 20-5-0.45 MEQ/L-%-% IV SOLN
INTRAVENOUS | Status: DC
  Administered 2011-10-31: 22:00:00 via INTRAVENOUS
  Filled 2011-10-31 (×4): qty 1000

## 2011-10-31 MED ORDER — LIDOCAINE HCL (CARDIAC) 20 MG/ML IV SOLN
INTRAVENOUS | Status: DC | PRN
  Administered 2011-10-31: 100 mg via INTRAVENOUS

## 2011-10-31 MED ORDER — BUPIVACAINE-EPINEPHRINE PF 0.5-1:200000 % IJ SOLN
INTRAMUSCULAR | Status: DC | PRN
  Administered 2011-10-31: 30 mL

## 2011-10-31 MED ORDER — ACETAMINOPHEN 10 MG/ML IV SOLN
INTRAVENOUS | Status: DC | PRN
  Administered 2011-10-31: 1000 mg via INTRAVENOUS

## 2011-10-31 MED ORDER — FENTANYL CITRATE 0.05 MG/ML IJ SOLN
INTRAMUSCULAR | Status: DC | PRN
  Administered 2011-10-31: 50 ug via INTRAVENOUS

## 2011-10-31 MED ORDER — METOCLOPRAMIDE HCL 10 MG PO TABS: 5.0000 mg | ORAL_TABLET | Freq: Three times a day (TID) | ORAL | Status: DC | PRN

## 2011-10-31 MED ORDER — ASPIRIN EC 325 MG PO TBEC
325.0000 mg | DELAYED_RELEASE_TABLET | Freq: Two times a day (BID) | ORAL | Status: DC
  Administered 2011-10-31 – 2011-11-01 (×2): 325 mg via ORAL
  Filled 2011-10-31 (×3): qty 1

## 2011-10-31 MED ORDER — LACTATED RINGERS IV SOLN
INTRAVENOUS | Status: DC
  Administered 2011-10-31: 12:00:00 via INTRAVENOUS

## 2011-10-31 MED ORDER — CHLORHEXIDINE GLUCONATE CLOTH 2 % EX PADS
6.0000 | MEDICATED_PAD | Freq: Every day | CUTANEOUS | Status: DC
  Administered 2011-10-31: 6 via TOPICAL

## 2011-10-31 MED ORDER — EPHEDRINE SULFATE 50 MG/ML IJ SOLN
INTRAMUSCULAR | Status: DC | PRN
  Administered 2011-10-31: 10 mg via INTRAVENOUS
  Administered 2011-10-31 (×2): 5 mg via INTRAVENOUS

## 2011-10-31 MED ORDER — LACTATED RINGERS IV SOLN
INTRAVENOUS | Status: DC | PRN
  Administered 2011-10-31 (×3): via INTRAVENOUS

## 2011-10-31 MED ORDER — PROPOFOL 10 MG/ML IV EMUL
INTRAVENOUS | Status: DC | PRN
  Administered 2011-10-31: 200 mg via INTRAVENOUS

## 2011-10-31 MED ORDER — ONDANSETRON HCL 4 MG/2ML IJ SOLN
INTRAMUSCULAR | Status: DC | PRN
  Administered 2011-10-31: 4 mg via INTRAVENOUS

## 2011-10-31 MED ORDER — ONDANSETRON HCL 4 MG/2ML IJ SOLN: 4.0000 mg | Freq: Four times a day (QID) | INTRAMUSCULAR | Status: DC | PRN

## 2011-10-31 MED ORDER — FENTANYL CITRATE 0.05 MG/ML IJ SOLN
INTRAMUSCULAR | Status: AC
  Filled 2011-10-31: qty 2

## 2011-10-31 MED ORDER — ONDANSETRON HCL 4 MG PO TABS: 4.0000 mg | ORAL_TABLET | Freq: Four times a day (QID) | ORAL | Status: DC | PRN

## 2011-10-31 MED ORDER — METOCLOPRAMIDE HCL 5 MG/ML IJ SOLN: 5.0000 mg | Freq: Three times a day (TID) | INTRAMUSCULAR | Status: DC | PRN

## 2011-10-31 MED ORDER — MORPHINE SULFATE 4 MG/ML IJ SOLN: 0.0500 mg/kg | INTRAMUSCULAR | Status: DC | PRN

## 2011-10-31 MED ORDER — MIDAZOLAM HCL 2 MG/2ML IJ SOLN
2.0000 mg | Freq: Once | INTRAMUSCULAR | Status: AC
  Administered 2011-10-31: 2 mg via INTRAVENOUS

## 2011-10-31 MED ORDER — CEFAZOLIN SODIUM 1-5 GM-% IV SOLN
1.0000 g | Freq: Four times a day (QID) | INTRAVENOUS | Status: AC
  Administered 2011-10-31 – 2011-11-01 (×3): 1 g via INTRAVENOUS
  Filled 2011-10-31 (×5): qty 50

## 2011-10-31 MED ORDER — HYDROMORPHONE HCL PF 1 MG/ML IJ SOLN: 0.2500 mg | INTRAMUSCULAR | Status: DC | PRN

## 2011-10-31 MED ORDER — FENTANYL CITRATE 0.05 MG/ML IJ SOLN
100.0000 ug | Freq: Once | INTRAMUSCULAR | Status: AC
  Administered 2011-10-31: 100 ug via INTRAVENOUS

## 2011-10-31 MED ORDER — CEFAZOLIN SODIUM 1-5 GM-% IV SOLN
INTRAVENOUS | Status: AC
  Filled 2011-10-31: qty 50

## 2011-10-31 MED ORDER — ACETAMINOPHEN 10 MG/ML IV SOLN
INTRAVENOUS | Status: AC
  Filled 2011-10-31: qty 100

## 2011-10-31 MED ORDER — MIDAZOLAM HCL 2 MG/2ML IJ SOLN
INTRAMUSCULAR | Status: AC
  Filled 2011-10-31: qty 2

## 2011-10-31 SURGICAL SUPPLY — 80 items
BANDAGE ELASTIC 4 VELCRO ST LF (GAUZE/BANDAGES/DRESSINGS) ×2 IMPLANT
BANDAGE ELASTIC 6 VELCRO ST LF (GAUZE/BANDAGES/DRESSINGS) ×2 IMPLANT
BANDAGE ESMARK 6X9 LF (GAUZE/BANDAGES/DRESSINGS) ×1 IMPLANT
BIT DRILL 2.5X2.75 QC CALB (BIT) ×4 IMPLANT
BIT DRILL 2.9 CANN QC NONSTRL (BIT) ×4 IMPLANT
BIT DRILL 2.9X70 QC CALB (BIT) ×4 IMPLANT
BIT DRILL CALIBRATED 2.7 (BIT) ×2 IMPLANT
BLADE SURG 10 STRL SS (BLADE) ×2 IMPLANT
BLADE SURG ROTATE 9660 (MISCELLANEOUS) IMPLANT
BNDG COHESIVE 4X5 TAN STRL (GAUZE/BANDAGES/DRESSINGS) ×2 IMPLANT
BNDG ESMARK 6X9 LF (GAUZE/BANDAGES/DRESSINGS) ×2
CLOTH BEACON ORANGE TIMEOUT ST (SAFETY) ×2 IMPLANT
COVER MAYO STAND STRL (DRAPES) ×2 IMPLANT
COVER SURGICAL LIGHT HANDLE (MISCELLANEOUS) ×2 IMPLANT
CUFF TOURNIQUET SINGLE 34IN LL (TOURNIQUET CUFF) ×2 IMPLANT
CUFF TOURNIQUET SINGLE 44IN (TOURNIQUET CUFF) IMPLANT
DRAPE C-ARM 42X72 X-RAY (DRAPES) ×2 IMPLANT
DRAPE C-ARMOR (DRAPES) ×2 IMPLANT
DRAPE INCISE IOBAN 66X45 STRL (DRAPES) ×2 IMPLANT
DRAPE OEC MINIVIEW 54X84 (DRAPES) IMPLANT
DRAPE U-SHAPE 47X51 STRL (DRAPES) IMPLANT
DRSG ADAPTIC 3X8 NADH LF (GAUZE/BANDAGES/DRESSINGS) ×2 IMPLANT
DURAPREP 26ML APPLICATOR (WOUND CARE) ×2 IMPLANT
ELECT REM PT RETURN 9FT ADLT (ELECTROSURGICAL) ×2
ELECTRODE REM PT RTRN 9FT ADLT (ELECTROSURGICAL) ×1 IMPLANT
GAUZE XEROFORM 1X8 LF (GAUZE/BANDAGES/DRESSINGS) IMPLANT
GLOVE BIO SURGEON STRL SZ7 (GLOVE) ×2 IMPLANT
GLOVE BIO SURGEON STRL SZ7.5 (GLOVE) ×4 IMPLANT
GLOVE BIOGEL PI IND STRL 8 (GLOVE) ×1 IMPLANT
GLOVE BIOGEL PI INDICATOR 8 (GLOVE) ×1
GOWN PREVENTION PLUS LG XLONG (DISPOSABLE) ×2 IMPLANT
GOWN PREVENTION PLUS XLARGE (GOWN DISPOSABLE) ×2 IMPLANT
GOWN STRL NON-REIN LRG LVL3 (GOWN DISPOSABLE) ×4 IMPLANT
K-WIRE ACE 1.6X6 (WIRE) ×4
KIT BASIN OR (CUSTOM PROCEDURE TRAY) ×2 IMPLANT
KIT ROOM TURNOVER OR (KITS) ×2 IMPLANT
KWIRE ACE 1.6X6 (WIRE) ×2 IMPLANT
MANIFOLD NEPTUNE II (INSTRUMENTS) IMPLANT
NEEDLE 22X1 1/2 (OR ONLY) (NEEDLE) IMPLANT
NS IRRIG 1000ML POUR BTL (IV SOLUTION) ×2 IMPLANT
PACK ORTHO EXTREMITY (CUSTOM PROCEDURE TRAY) ×2 IMPLANT
PAD ARMBOARD 7.5X6 YLW CONV (MISCELLANEOUS) ×4 IMPLANT
PAD CAST 4YDX4 CTTN HI CHSV (CAST SUPPLIES) ×1 IMPLANT
PADDING CAST COTTON 4X4 STRL (CAST SUPPLIES) ×1
PADDING CAST COTTON 6X4 STRL (CAST SUPPLIES) ×2 IMPLANT
PLATE 9H 184 RT MED DIST TIB (Plate) ×2 IMPLANT
PLATE ACE 100DEG 6HOLE (Plate) ×2 IMPLANT
SCREW ACE CAN 4.0 46M (Screw) ×2 IMPLANT
SCREW CORTICAL 3.5MM  16MM (Screw) ×2 IMPLANT
SCREW CORTICAL 3.5MM  30MM (Screw) ×3 IMPLANT
SCREW CORTICAL 3.5MM 14MM (Screw) ×2 IMPLANT
SCREW CORTICAL 3.5MM 16MM (Screw) ×2 IMPLANT
SCREW CORTICAL 3.5MM 18MM (Screw) ×2 IMPLANT
SCREW CORTICAL 3.5MM 26MM (Screw) ×4 IMPLANT
SCREW CORTICAL 3.5MM 30MM (Screw) ×3 IMPLANT
SCREW CORTICAL 3.5X46MM (Screw) ×2 IMPLANT
SCREW LOCK CANC STAR 4X16 (Screw) ×2 IMPLANT
SCREW LOCK CANC STAR 4X18 (Screw) ×2 IMPLANT
SCREW LOCK CORT STAR 3.5X28 (Screw) ×2 IMPLANT
SCREW LOCK CORT STAR 3.5X34 (Screw) ×2 IMPLANT
SCREW LOCK CORT STAR 3.5X36 (Screw) ×2 IMPLANT
SCREW LOCK CORT STAR 3.5X46 (Screw) ×2 IMPLANT
SCREW LOCK CORT STAR 3.5X50 (Screw) ×2 IMPLANT
SCREW LOW PROFILE 18MMX3.5MM (Screw) ×2 IMPLANT
SCREW NON LOCKING LP 3.5 14MM (Screw) ×2 IMPLANT
SCREW NON LOCKING LP 3.5 16MM (Screw) ×4 IMPLANT
SPONGE GAUZE 4X4 12PLY (GAUZE/BANDAGES/DRESSINGS) ×2 IMPLANT
SPONGE LAP 4X18 X RAY DECT (DISPOSABLE) ×4 IMPLANT
STAPLER VISISTAT 35W (STAPLE) IMPLANT
SUCTION FRAZIER TIP 10 FR DISP (SUCTIONS) ×2 IMPLANT
SUT ETHILON 3 0 PS 1 (SUTURE) ×8 IMPLANT
SUT ETHILON 4 0 PS 2 18 (SUTURE) IMPLANT
SUT VIC AB 0 CTB1 27 (SUTURE) IMPLANT
SUT VIC AB 2-0 FS1 27 (SUTURE) IMPLANT
SUT VIC AB 3-0 X1 27 (SUTURE) ×4 IMPLANT
SYR CONTROL 10ML LL (SYRINGE) IMPLANT
TOWEL OR 17X24 6PK STRL BLUE (TOWEL DISPOSABLE) ×2 IMPLANT
TOWEL OR 17X26 10 PK STRL BLUE (TOWEL DISPOSABLE) ×2 IMPLANT
TUBE CONNECTING 12X1/4 (SUCTIONS) ×2 IMPLANT
WATER STERILE IRR 1000ML POUR (IV SOLUTION) ×2 IMPLANT

## 2011-10-31 NOTE — Transfer of Care (Signed)
Immediate Anesthesia Transfer of Care Note  Patient: Joseph Zhang  Procedure(s) Performed: Procedure(s) (LRB): OPEN REDUCTION INTERNAL FIXATION (ORIF) ANKLE FRACTURE (Right)  Patient Location: PACU  Anesthesia Type: GA combined with regional for post-op pain  Level of Consciousness: awake, oriented and patient cooperative  Airway & Oxygen Therapy: Patient Spontanous Breathing and Patient connected to nasal cannula oxygen  Post-op Assessment: Report given to PACU RN, Post -op Vital signs reviewed and stable and Patient moving all extremities  Post vital signs: Reviewed and stable  Complications: No apparent anesthesia complications

## 2011-10-31 NOTE — Progress Notes (Signed)
UR COMPLETED  

## 2011-10-31 NOTE — Progress Notes (Signed)
PATIENT ID: Joseph Zhang  MRN: [id number]  DOB/AGE:  January 20, 1966 / 46 y.o.    Procedure(s) (LRB): OPEN REDUCTION INTERNAL FIXATION (ORIF) ANKLE FRACTURE (Right)  Subjective: Pain is mild.  Able to sleep last night.  Objective: Vital signs in last 24 hours: Temp:  [97.1 F (36.2 C)-97.5 F (36.4 C)] 97.5 F (36.4 C) (05/02 0616) Pulse Rate:  [62-71] 64  (05/02 0616) Resp:  [16-18] 18  (05/02 0616) BP: (113-129)/(64-78) 129/78 mmHg (05/02 0616) SpO2:  [97 %-100 %] 97 % (05/02 0616)  Intake/Output from previous day: 05/01 0701 - 05/02 0700 In: 750 [I.V.:750] Out: -  Intake/Output this shift: Total I/O In: 750 [I.V.:750] Out: -    Basename 10/30/11 0945  HGB 12.2*    Basename 10/30/11 0945  WBC 6.6  RBC 4.03*  HCT 36.5*  PLT 176    Basename 10/30/11 0945  NA 135  K 3.9  CL 104  CO2 24  BUN 8  CREATININE 1.12  GLUCOSE 113*  CALCIUM 8.4    Basename 10/30/11 0945  LABPT --  INR 1.11    Physical Exam: Splint intact Wiggles toes, no pain with passive stretch Comp soft   Assessment/Plan:   Procedure(s) (LRB): OPEN REDUCTION INTERNAL FIXATION (ORIF) ANKLE FRACTURE (Right)  CT shows intraarticular involvement, but not displaced. Risks / benefits of surgery discussed Consent on chart  NPO for OR Preop antibiotics bactroban for + MRSA screen    Keylee Shrestha WILLIAM 10/31/2011, 6:58 AM

## 2011-10-31 NOTE — Anesthesia Postprocedure Evaluation (Signed)
  Anesthesia Post-op Note  Patient: Joseph Zhang  Procedure(s) Performed: Procedure(s) (LRB): OPEN REDUCTION INTERNAL FIXATION (ORIF) ANKLE FRACTURE (Right)  Patient Location: PACU  Anesthesia Type: GA combined with regional for post-op pain  Level of Consciousness: awake, alert  and oriented  Airway and Oxygen Therapy: Patient Spontanous Breathing and Patient connected to nasal cannula oxygen  Post-op Pain: none  Post-op Assessment: Post-op Vital signs reviewed  Post-op Vital Signs: Reviewed  Complications: No apparent anesthesia complications

## 2011-10-31 NOTE — Preoperative (Signed)
Beta Blockers   Reason not to administer Beta Blockers:Not Applicable 

## 2011-10-31 NOTE — Anesthesia Procedure Notes (Addendum)
Anesthesia Regional Block:  Popliteal block  Pre-Anesthetic Checklist: ,, timeout performed, Correct Patient, Correct Site, Correct Laterality, Correct Procedure, Correct Position, site marked, Risks and benefits discussed,  Surgical consent,  Pre-op evaluation,  At surgeon's request and post-op pain management  Laterality: Right and Lower  Prep: chloraprep       Needles:  Injection technique: Single-shot  Needle Type: Echogenic Needle     Needle Length: 9cm  Needle Gauge: 22 and 22 G    Additional Needles:  Procedures: ultrasound guided Popliteal block Narrative:  Start time: 10/31/2011 12:35 PM End time: 10/31/2011 12:46 PM Injection made incrementally with aspirations every 5 mL.  Performed by: Personally  Anesthesiologist: Sheldon Silvan, MD  Additional Notes: Marcaine 0.5% with EPI 1:200000.  30 ml  Femoral nerve block Procedure Name: LMA Insertion Date/Time: 10/31/2011 1:09 PM Performed by: Glendora Score A Pre-anesthesia Checklist: Patient identified, Emergency Drugs available, Suction available and Patient being monitored Patient Re-evaluated:Patient Re-evaluated prior to inductionOxygen Delivery Method: Circle system utilized Preoxygenation: Pre-oxygenation with 100% oxygen Intubation Type: IV induction LMA: LMA with gastric port inserted LMA Size: 5.0 Number of attempts: 1 Placement Confirmation: positive ETCO2 and breath sounds checked- equal and bilateral Tube secured with: Tape Dental Injury: Teeth and Oropharynx as per pre-operative assessment

## 2011-10-31 NOTE — Op Note (Signed)
Procedure(s): OPEN REDUCTION INTERNAL FIXATION (ORIF) ANKLE FRACTURE Procedure Note  Joseph Zhang male 46 y.o. 10/31/2011  Procedure(s) and Anesthesia Type:    * OPEN REDUCTION INTERNAL FIXATION (ORIF) right distal tibial pilon fracture with fixation of the fibula- General  Surgeon(s) and Role:    * [name], MD - Primary   Indications:  46 y.o. male s/p injury to the right ankle sliding into first base playing softball the night before last. He suffered a distal tibia and fibula fracture. The distal tibia fracture extended into the articular surface with coronal and sagittal splits. He was Indicated for surgery to promote anatomic restoration of joint and prevent displacement of the articular splits. he understood risks benefits alternatives to the procedure including but not limited to risk of bleeding infection damage to neurovascular structures risk of nonunion, malunion, potential need for future hardware removal.     Surgeon: [name]   Assistants: Damita Lack PA-S.  Anesthesia: General endotracheal anesthesia    Procedure Detail  Findings: Near-anatomic reduction of the fracture using a 6-hole one third tubular plate on the fibula and a precontoured locking medial plate on the distal tibia. One anterior to posterior 4-0 cancellus screw was used to compress the coronal split. The medial plate was placed in a submuscular fashion.  Estimated Blood Loss:  Minimal         Drains: none  Blood Given: none         Specimens: none        Complications:  * No complications entered in OR log *         Disposition: PACU - hemodynamically stable.         Condition: stable    Procedure:  The patient was identified in the preoperative  holding area where I personally marked the operative site after  verifying site side and procedure with the patient. He had a popliteal block given by the attending anesthesiologist in the holding area.  The patient was taken back  to the operating room where general anesthesia was induced without  Complication. The patient was placed in supine position with a bump under the operative hip. A non sterile tourniquet was applied to the thigh. The patient did receive IV antibiotics prior to the incision.   After the appropriate time-out, the limb was exsanguinated and the tourniquet was elevated to 300 mmHg.   A lateral incision was made over the fracture site and dissection was carried down the lateral fibula.  The fracture was a exposed and cleaned of hematoma.  Reduction was carried out using reduction forceps and manipulation of the ankle.  A 6 hole plate was laid laterally and felt to be appropriate sized.  Holes proximal and distal to the fracture were sequentially drill, measured and filled with appropriate sized bicortical and unicortical screws.  Appropriate length and alignment were verified on fluoroscopic imaging.    Attention was then turned anteriorly where a small percutaneous 5 mm incision was made localizing the fluoroscopic imaging and spreading carefully down to the anterior tibia just above the joint. Guidewire from the 4-0 cancellus cannulated screw system was then placed anterior posterior in its position was verified in AP and lateral planes with fluoroscopy. The appropriate measurement was made it was overdrilled. A partially threaded 4-0 cancellus screw was then placed anterior to posterior with good compression.  Attention was then turned to the medial side were a approximate 6 cm curvalinear incision was made over the distal medial  malleolus. Dissection was carried down to the medial malleolus bringing the saphenous vein anteriorly. A large key elevator was then used to elevate the muscle off the medial tibial surface submuscularly. The appropriate size plate was then slid up along the surface of the medial tibia and its position was verified in AP and lateral planes. One proximal and  one distal K wire was used through the plate to hold the position. After some minimal adjustments were made position was felt to be appropriate. Distally one of the holes was drilled measured and filled with the appropriate size 35 nonlocking bicortical screw bringing the plate nicely down to the medial aspect of the tibia. The locking screws distally then were sequentially drilled measured and filled with the appropriate sized locking screws. The 2 most distal were not filled as they would hit the previously placed anterior posterior screw. Proximally the proximal 3 holes in the plate were localized with fluoroscopy and a small incision was made approximately 3 cm. Careful dissection was made down to the plate and each proximal hole was then drilled measured and filled with the appropriate size 3.5 bicortical nonlocking screw. Excellent fixation was noted.  Final fluoroscopic imaging in AP and lateral planes at the ankle and proximal aspect of the plate demonstrated near anatomic reduction of the fracture with slight residual displacement. Plate and screws were in good position. Point alignment were restored at the ankle.   The wounds were then copiously irrigated and closed in layers with 3-0 vicryl in a deep layer and 3-0 nylon for skin closure.  Sterile dressings were then applied and well padded well molded splint in a plantigrade position with bulky cotton was applied.  The tourniquet was let down for total tourniquet time of 113 minutes at 300 mm mercury.  The patient was then allowed to awaken from GA, taken to the PACU in stable condition.  POSTOPERATIVE PLAN: The patient will be non-weightbearing on the operative Extremity and will will be kept in the hospital one to 2 days for physical therapy and then will follow up in 10-14 days for wound check.

## 2011-11-01 LAB — CBC
MCH: 30.5 pg (ref 26.0–34.0)
MCHC: 34.3 g/dL (ref 30.0–36.0)
MCV: 89 fL (ref 78.0–100.0)
Platelets: 149 10*3/uL — ABNORMAL LOW (ref 150–400)
RDW: 12.5 % (ref 11.5–15.5)

## 2011-11-01 LAB — BASIC METABOLIC PANEL
BUN: 5 mg/dL — ABNORMAL LOW (ref 6–23)
CO2: 27 mEq/L (ref 19–32)
Calcium: 8.3 mg/dL — ABNORMAL LOW (ref 8.4–10.5)
Creatinine, Ser: 1.24 mg/dL (ref 0.50–1.35)
Glucose, Bld: 134 mg/dL — ABNORMAL HIGH (ref 70–99)

## 2011-11-01 MED ORDER — OXYCODONE-ACETAMINOPHEN 5-325 MG PO TABS: 1.0000 | ORAL_TABLET | ORAL | Status: DC | PRN

## 2011-11-01 NOTE — Progress Notes (Signed)
CARE MANAGEMENT NOTE 11/01/2011  Patient:  Joseph Zhang, Joseph Zhang   Account Number:  [id number]  Date Initiated:  11/01/2011  Documentation initiated by:  Vance Peper  Subjective/Objective Assessment:   46 yr old male s/p ORIF right tib/fib fracture     Action/Plan:   No home health needs, pt needs 3in1   Anticipated DC Date:  11/01/2011   Anticipated DC Plan:  HOME/SELF CARE      DC Planning Services  CM consult      PAC Choice  DURABLE MEDICAL EQUIPMENT   Choice offered to / List presented to:     DME arranged  3-N-1      DME agency  [facility]     HH arranged  NA      HH agency  NA   Status of service:  Completed, signed off  Discharge Disposition:  HOME/SELF CARE

## 2011-11-01 NOTE — Progress Notes (Signed)
PATIENT ID: Joseph Zhang  MRN: [id number]  DOB/AGE:  01-Feb-1966 / 46 y.o.  44 Day Post-Op Procedure(s) (LRB): OPEN REDUCTION INTERNAL FIXATION (ORIF) ANKLE FRACTURE (Right)  Subjective: Pain is improving.  After nerve block wore off had to catch up with oral and IV meds.  Now under good control.  No c/o chest pain or SOB.    Objective: Vital signs in last 24 hours: Temp:  [97.4 F (36.3 C)-99.5 F (37.5 C)] 99.4 F (37.4 C) (05/03 0636) Pulse Rate:  [64-97] 97  (05/03 0636) Resp:  [11-19] 16  (05/03 0636) BP: (123-136)/(66-85) 136/84 mmHg (05/03 0636) SpO2:  [95 %-100 %] 100 % (05/03 0636) Weight:  [75.8 kg (167 lb 1.7 oz)] 75.8 kg (167 lb 1.7 oz) (05/02 1547)  Intake/Output from previous day: 05/02 0701 - 05/03 0700 In: 2490 [P.O.:240; I.V.:2250] Out: 1701 [Urine:1701] Intake/Output this shift:     Basename 11/01/11 0640 10/30/11 0945  HGB 11.1* 12.2*    Basename 11/01/11 0640 10/30/11 0945  WBC 8.2 6.6  RBC 3.64* 4.03*  HCT 32.4* 36.5*  PLT 149* 176    Basename 11/01/11 0640 10/30/11 0945  NA 137 135  K 3.8 3.9  CL 102 104  CO2 27 24  BUN 5* 8  CREATININE 1.24 1.12  GLUCOSE 134* 113*  CALCIUM 8.3* 8.4    Basename 10/30/11 0945  LABPT --  INR 1.11    Physical Exam: Splint C/D/I Toes wiggle passive and active without pain NVID  Assessment/Plan: 44 Day Post-Op Procedure(s) (LRB): OPEN REDUCTION INTERNAL FIXATION (ORIF) ANKLE FRACTURE (Right)   Up with therapy Non Weight Bearing (NWB)  VTE prophylaxis: ECASA BID D/c home today if clears PT and pain well controlled.   [name] 11/01/2011, 7:54 AM

## 2011-11-01 NOTE — Discharge Instructions (Signed)
Elevate above heart on pillows Wiggle toes No Weightbearing Ice PRN Keep splint clean and dry until follow up

## 2011-11-01 NOTE — Progress Notes (Signed)
Physical Therapy Evaluation Note  Past Medical History  Diagnosis Date  . Pneumonia 1995  . Chest pain     "not heart related"  . Osgood-Schlatter's disease   . Migraines 10/30/11    "2 in the last 5 years"  . History of ADHD     "as a child"    Past Surgical History  Procedure Date  . No past surgeries      11/01/11 0818  PT Visit Information  Last PT Received On 11/01/11  Assistance Needed +1  PT Time Calculation  PT Start Time 0824  PT Stop Time 0851  PT Time Calculation (min) 27 min  Subjective Data  Subjective Pt received supine in bed with R LE elevated with varied pain in R LE from 6-10/10 pain  Precautions  Precautions Fall  Restrictions  Weight Bearing Restrictions Yes  RLE Weight Bearing NWB  Home Living  Lives With Spouse  Available Help at Discharge Family;Available 24 hours/day  Type of Home House  Home Access Stairs to enter  Entrance Stairs-Number of Steps 1  Entrance Stairs-Rails None  Home Layout Two level;Able to live on main level with bedroom/bathroom  Alternate Level Stairs-Number of Steps 12  Bathroom Shower/Tub Tub/shower unit;Walk-in Warden/ranger (none)  Additional Comments patient reports "I'll be staying on the first floor." "my wife is a pro at crutches, she'll help me."  Prior Function  Level of Independence Independent  Able to Take Stairs? Yes  Driving Yes  Vocation Full time employment  Communication  Communication No difficulties  Cognition  Overall Cognitive Status Appears within functional limits for tasks assessed/performed  Arousal/Alertness Awake/alert  Orientation Level Oriented X4 / Intact  Behavior During Session [facility] for tasks performed  Right Upper Extremity Assessment  RUE ROM/Strength/Tone [facility] for tasks assessed  Left Upper Extremity Assessment  LUE ROM/Strength/Tone WFL for tasks assessed  Right Lower Extremity Assessment  RLE ROM/Strength/Tone Deficits (WFL except  ankle N/T due to in splint)  Left Lower Extremity Assessment  LLE ROM/Strength/Tone WFL for tasks assessed  Trunk Assessment  Trunk Assessment Normal  Bed Mobility  Bed Mobility Supine to Sit  Supine to Sit 5: Supervision  Details for Bed Mobility Assistance increased time due to R LE pain, pt able to manage R LE I'ly  Transfers  Transfers Sit to Stand;Stand to Sit  Sit to Stand 4: Min guard;From bed;With upper extremity assist  Stand to Sit 4: Min guard;With upper extremity assist;To chair/3-in-1  Details for Transfer Assistance patient instructed on crutch management during transfers  Ambulation/Gait  Ambulation/Gait Assistance 5: Supervision (due to first time ambulating)  Ambulation Distance (Feet) 50 Feet  Assistive device Crutches  Ambulation/Gait Assistance Details pt 100% compliant with R LE NWB  Gait Pattern Step-to pattern  Gait velocity slow  Stairs Yes  Stairs Assistance 4: Min guard  Stair Management Technique No rails;With crutches  Number of Stairs 1  (to mimic home set up)  Wheelchair Mobility  Wheelchair Mobility No  Balance  Balance Assessed Yes  Dynamic Standing Balance  Dynamic Standing - Balance Support Bilateral upper extremity supported (via crutches)  Dynamic Standing - Level of Assistance 5: Stand by assistance  Dynamic Standing - Comments pt initially unsteady due to first time up however requires bilat UE support to maintain balance  PT - End of Session  Equipment Utilized During Treatment Gait belt  Activity Tolerance Patient tolerated treatment well  Patient left in chair;with call bell/phone within  reach  Nurse Communication Mobility status  PT Assessment  Clinical Impression Statement Pt s/p ORIF R ankle presenting with increased R LE pain especialy in dependent position. Patient with 24/7 assist available. [employer] called to order crutches for patient to go home with.   PT Recommendation/Assessment Patient needs continued PT services  PT  Problem List Decreased strength;Decreased range of motion;Decreased activity tolerance;Decreased balance;Decreased mobility  Barriers to Discharge None  PT Therapy Diagnosis  Difficulty walking;Abnormality of gait;Generalized weakness;Acute pain  PT Plan  PT Frequency Min 5X/week  PT Treatment/Interventions DME instruction;Gait training;Therapeutic activities;Therapeutic exercise;Stair training;Functional mobility training  PT Recommendation  Follow Up Recommendations Supervision/Assistance - 24 hour  Equipment Recommended (crutches, provided by ortho tech)  Individuals Consulted  Consulted and Agree with Results and Recommendations Patient  Acute Rehab PT Goals  PT Goal Formulation With patient  Time For Goal Achievement 11/08/11  Potential to Achieve Goals Good  Pt will go Supine/Side to Sit with modified independence;with HOB 0 degrees  PT Goal: Supine/Side to Sit - Progress Goal set today  Pt will go Sit to Stand with modified independence (with crutches)  PT Goal: Sit to Stand - Progress Goal set today  Pt will go Stand to Sit with modified independence (with good crutch management)  PT Goal: Stand to Sit - Progress Goal set today  Pt will Ambulate >150 feet;with modified independence;with crutches  PT Goal: Ambulate - Progress Goal set today  Pt will Go Up / Down Stairs 1-2 stairs;with crutches  PT Goal: Up/Down Stairs - Progress Goal set today  Written Expression  Dominant Hand Right    Pain: 6-10/10 R LE pain  Lewis Shock, PT, DPT Pager #: [phone] Office #: [phone]

## 2011-11-01 NOTE — Progress Notes (Signed)
Orthopedic Tech Progress Note Patient Details:  Joseph Zhang 04/19/1966 [id number]  Other Ortho Devices Type of Ortho Device: Crutches Ortho Device Interventions: Application   Karolyn Messing T 11/01/2011, 9:53 AM

## 2011-11-01 NOTE — Progress Notes (Signed)
D/C instructions reviewed with patient and wife. RX x 1 given. No hh services needed. 3n1 bsc ordered for pt prior to d/c for shower assistance. No other hh equipment needed. All questions answered. Pt d/c'ed via wheelchair in stable condition

## 2011-11-04 ENCOUNTER — Encounter (HOSPITAL_COMMUNITY): Payer: Self-pay | Admitting: Orthopedic Surgery

## 2011-11-04 NOTE — Discharge Summary (Signed)
Patient ID: PASCUAL MANTEL MRN: [id number] DOB/AGE: [birth year] 44 y.o.  Admit date: 10/30/2011 Discharge date: 11/04/2011  Admission Diagnoses:  R comminuted distal Tib/fib fx  Discharge Diagnoses:  Same  Past Medical History  Diagnosis Date  . Pneumonia 1995  . Chest pain     "not heart related"  . Osgood-Schlatter's disease   . Migraines 10/30/11    "2 in the last 5 years"  . History of ADHD     "as a child"    Surgeries: Procedure(s): OPEN REDUCTION INTERNAL FIXATION (ORIF) ANKLE FRACTURE on 10/30/2011 - 10/31/2011   Consultants:    Discharged Condition: Improved  Hospital Course: JERRION TABBERT is an 46 y.o. male who was admitted 10/30/2011 for operative treatment of R comminuted distal Tib/fib fx. The patient was indicated for operative treatment to promote anatomic restoration of the joint and allow earlier ambulation.  the patient was taken to the operating room on 10/30/2011 - 10/31/2011 and underwent  Procedure(s): OPEN REDUCTION INTERNAL FIXATION (ORIF) ANKLE FRACTURE.    Patient was given perioperative antibiotics:  Anti-infectives     Start     Dose/Rate Route Frequency Ordered Stop   10/31/11 1900   ceFAZolin (ANCEF) IVPB 1 g/50 mL premix        1 g 100 mL/hr over 30 Minutes Intravenous Every 6 hours 10/31/11 1819 11/01/11 0710           Patient was given sequential compression devices, early ambulation, and aspirin to prevent DVT.  Patient benefited maximally from hospital stay and there were no complications.  Pain was well controlled on discharge and he worked with physical therapy prior to leaving.  Recent vital signs: No data found.    Recent laboratory studies: No results found for this basename: WBC:2,HGB:2,HCT:2,PLT:2,NA:2,K:2,CL:2,CO2:2,BUN:2,CREATININE:2,GLUCOSE:2,PT:2,INR:2,CALCIUM,2: in the last 72 hours   Discharge Medications:   Medication List  As of 11/04/2011 11:09 AM   TAKE these medications         oxyCODONE-acetaminophen 5-325 MG  per tablet   Commonly known as: PERCOCET   Take 1-2 tablets by mouth every 4 (four) hours as needed. For pain            Diagnostic Studies: Dg Tibia/fibula Right  10/31/2011  *RADIOLOGY REPORT*  Clinical Data: Ankle fractures.  RIGHT TIBIA AND FIBULA - 2 VIEW  Comparison: Intraoperative spot films.  Findings: Good position and alignment of the fixating hardware and fractures.  No complicating features are demonstrated.  IMPRESSION: Internal fixation and near anatomic alignment.  Original Report Authenticated By: P. Loralie Champagne, M.D.   Dg Tibia/fibula Right  10/30/2011  *RADIOLOGY REPORT*  Clinical Data: Severe leg pain after splinting leg fracture.  RIGHT TIBIA AND FIBULA - 2 VIEW  Comparison: 10/29/2011  Findings: Cast artifact obscures detailed osseous evaluation. Complex distal tibia and fibular fractures with medial displacement and angulation, similar to prior.  Nondisplaced medial malleolar fracture again suggested.  Ankle mortise appears congruent. No definite proximal fibular fracture however the evaluation is obscured by overlying cast artifact.  IMPRESSION: Complex distal tibia and fibular fractures in similar alignment to prior.  No definite additional fracture however cast artifact obscures osseous detail.  If clinical concern for a proximal injury persists, recommend repeat radiograph without cast.  Original Report Authenticated By: Waneta Martins, M.D.   Dg Ankle Complete Right  10/31/2011  *RADIOLOGY REPORT*  Clinical Data: Right ankle fractures.  RIGHT ANKLE - COMPLETE 3+ VIEW  Comparison: CT scan 10/30/2011.  Findings: There is a long medial  side plate on the tibia with proximal and distal fixating screws.  Good position and alignment of the complex tibial fractures.  The ankle mortise is maintained. There is a lateral side plate and screws on the distal fibula fixating the fractures.  Anatomic alignment.  IMPRESSION: Internal fixation and anatomic alignment.  Original Report  Authenticated By: P. Loralie Champagne, M.D.   Ct Ankle Right Wo Contrast  10/30/2011  *RADIOLOGY REPORT*  Clinical Data: Baseball injury, distal tibial and fibular fractures.  CT OF THE RIGHT ANKLE WITH CONTRAST  Technique:  Multidetector CT imaging was performed following the standard protocol during bolus administration of intravenous contrast.  Comparison: Conventional radiographs from 10/29/2011 and 10/30/2011  Findings: Comminuted distal tibial fracture noted, primarily oblique extending from the posterior diaphysis to the anterior metaphysis, with 8 mm of distraction and 10 mm of overlap along this main oblique fracture plane, and with an intermediate area cortical fragment posteromedially.  In the dominant distal articular fracture fragment, there is also longitudinal extension from the oblique fracture plane extending to the distal articular surface, primarily nondisplaced but with parasagittal and coronal components.  These do involve the distal articular surface as shown on image 56 of series 2.  There is no significant cortical step off along the distal articular surface at the fracture sites.  Oblique distal fibular metadiaphyseal fracture noted, with 7 mm of medial displacement of the distal fracture fragment with respect to the proximal.  No calcaneal or talar fractures observed.  Visualized portions of the cuboid appear normal.  The cuneiforms appear unremarkable.  Although one tibial fracture plane does extend adjacent to the myotendinous junction of the tibialis posterior, we do not demonstrate tendon entrapment.  Expected surrounding subcutaneous edema noted.  IMPRESSION:  1.  Comminuted distal tibial fracture noted with dominant oblique fracture plane with mild residual distraction and overlapped, and nondisplaced fracture planes extending in the distal tibial fragment to the distal tibial articular surface, without step off along the articular surface or tendon entrapment. 2.  Oblique fracture of  the distal fibular metadiaphysis.  Original Report Authenticated By: Dellia Cloud, M.D.   Dg Ankle Right Port  10/29/2011  *RADIOLOGY REPORT*  Clinical Data: Post reduction right ankle fracture.  PORTABLE RIGHT ANKLE - 2 VIEW  Comparison: 10/29/2011  Findings: Interval improved alignment of the complex distal tibia and fibular fractures. There is some residual lateral displacement and angulation. Nondisplaced medial malleolus fracture. Detailed osseous evaluation is obscured by overlying cast artifact.  IMPRESSION: Post reduction distal tibia/fibular fractures.  Improved alignment with mild residual lateral displacement and angulation.  Original Report Authenticated By: Waneta Martins, M.D.   Dg Ankle Right Port  10/29/2011  *RADIOLOGY REPORT*  Clinical Data: Softball injury, now with obvious deformity  PORTABLE RIGHT ANKLE - 2 VIEW  Comparison: None.  Findings:  There is a complete, oblique fracture of the distal tibial and adjacent distal fibular diaphysis with foreshortening of the fracture fragments and angulation, apex anterior and lateral.  The ankle mortise appears preserved on the provided AP radiograph. There is expected adjacent soft tissue swelling.  No definite radiopaque foreign body.  IMPRESSION: Complete, oblique fractures of the distal diaphysis of the tibia and fibula with angulation and foreshortening.  Ankle mortise appears preserved on solitary provided AP radiograph.  Original Report Authenticated By: Waynard Reeds, M.D.   Dg C-arm [phone] Min  10/31/2011  *RADIOLOGY REPORT*  Clinical Data: Ankle fractures.  DG C-ARM 61-120 MIN  Comparison: None.  Findings:  Fluoroscopic guidance of an the open reduction and internal fixation of tibia and fibular fractures.  IMPRESSION: Fluoroscopy guided operative procedure.  Original Report Authenticated By: P. Loralie Champagne, M.D.    Disposition: 01-Home or Self Care  Discharge Orders    Future Orders Please Complete By Expires    Diet - low sodium heart healthy      Call MD / Call 911      Comments:   If you experience chest pain or shortness of breath, CALL 911 and be transported to the hospital emergency room.  If you develope a fever above 101 F, pus (white drainage) or increased drainage or redness at the wound, or calf pain, call your surgeon's office.   Constipation Prevention      Comments:   Drink plenty of fluids.  Prune juice may be helpful.  You may use a stool softener, such as Colace (over the counter) 100 mg twice a day.  Use MiraLax (over the counter) for constipation as needed.   Increase activity slowly as tolerated      Weight Bearing as taught in Physical Therapy      Comments:   Use a walker or crutches as instructed.   Driving restrictions      Comments:   No driving for 8 weeks      Follow-up Information    Follow up in 2 weeks.        He was instructed to take one regular strength aspirin twice daily for 3 weeks after surgery.   Signed: [name] 11/04/2011, 11:09 AM

## 2012-12-03 ENCOUNTER — Encounter (HOSPITAL_COMMUNITY): Payer: Self-pay | Admitting: Cardiology

## 2012-12-03 ENCOUNTER — Emergency Department (HOSPITAL_COMMUNITY)
Admission: EM | Admit: 2012-12-03 | Discharge: 2012-12-03 | Disposition: A | Discharge status: Home / Self Care | Payer: Self-pay | Attending: Emergency Medicine | Admitting: Emergency Medicine

## 2012-12-03 DIAGNOSIS — Z8701 Personal history of pneumonia (recurrent): Secondary | ICD-10-CM | POA: Insufficient documentation

## 2012-12-03 DIAGNOSIS — Y929 Unspecified place or not applicable: Secondary | ICD-10-CM | POA: Insufficient documentation

## 2012-12-03 DIAGNOSIS — Y9389 Activity, other specified: Secondary | ICD-10-CM | POA: Insufficient documentation

## 2012-12-03 DIAGNOSIS — Z8679 Personal history of other diseases of the circulatory system: Secondary | ICD-10-CM | POA: Insufficient documentation

## 2012-12-03 DIAGNOSIS — R51 Headache: Secondary | ICD-10-CM | POA: Insufficient documentation

## 2012-12-03 DIAGNOSIS — R110 Nausea: Secondary | ICD-10-CM | POA: Insufficient documentation

## 2012-12-03 DIAGNOSIS — Z8659 Personal history of other mental and behavioral disorders: Secondary | ICD-10-CM | POA: Insufficient documentation

## 2012-12-03 DIAGNOSIS — A6920 Lyme disease, unspecified: Secondary | ICD-10-CM | POA: Insufficient documentation

## 2012-12-03 DIAGNOSIS — Z8739 Personal history of other diseases of the musculoskeletal system and connective tissue: Secondary | ICD-10-CM | POA: Insufficient documentation

## 2012-12-03 DIAGNOSIS — Z87891 Personal history of nicotine dependence: Secondary | ICD-10-CM | POA: Insufficient documentation

## 2012-12-03 DIAGNOSIS — R21 Rash and other nonspecific skin eruption: Secondary | ICD-10-CM | POA: Insufficient documentation

## 2012-12-03 DIAGNOSIS — L299 Pruritus, unspecified: Secondary | ICD-10-CM | POA: Insufficient documentation

## 2012-12-03 MED ORDER — DOXYCYCLINE HYCLATE 100 MG PO CAPS: 100.0000 mg | ORAL_CAPSULE | Freq: Two times a day (BID) | ORAL | Status: DC

## 2012-12-03 NOTE — ED Notes (Signed)
Pt reports that he was bitten by a tick on the left side of his rib cage. States that they removed the tick but now has a reddened area.

## 2012-12-03 NOTE — ED Provider Notes (Signed)
History     CSN: [id number]  Arrival date & time 12/03/12  1334   First MD Initiated Contact with Patient 12/03/12 1407      Chief Complaint  Patient presents with  . Tick Removal    (Consider location/radiation/quality/duration/timing/severity/associated sxs/prior treatment) HPI Comments: Patient found tick on left rib cage under axilla 5-6 days ago, wife removed tick. Tick is described as brown with a white spot on its back. No symptoms initially but awoke with severe itching over area and noticed redness around the bite, today.  He is unsure how long the tick was on.  Denies fevers, chills, myalgias, arthralgias, diffuse rash, focal neurologic deficits.   The history is provided by the patient and the spouse.    Past Medical History  Diagnosis Date  . Pneumonia 1995  . Chest pain     "not heart related"  . Osgood-Schlatter's disease   . Migraines 10/30/11    "2 in the last 5 years"  . History of ADHD     "as a child"    Past Surgical History  Procedure Laterality Date  . No past surgeries    . Orif ankle fracture  10/31/2011    Procedure: OPEN REDUCTION INTERNAL FIXATION (ORIF) ANKLE FRACTURE;  Surgeon: [name], MD;  Location: [facility] OR;  Service: Orthopedics;  Laterality: Right;    History reviewed. No pertinent family history.  History  Substance Use Topics  . Smoking status: Former Smoker -- 1.00 packs/day for 20 years    Types: Cigarettes    Quit date: 10/30/2011  . Smokeless tobacco: Never Used  . Alcohol Use: Yes     Comment: 10/30/11 "occasionally have a beer w/friends"      Review of Systems  Constitutional: Negative for fever and chills.  HENT: Negative for sore throat.   Eyes: Negative for visual disturbance.  Respiratory: Negative for cough and shortness of breath.   Cardiovascular: Negative for chest pain.  Gastrointestinal: Positive for nausea. Negative for vomiting, abdominal pain, diarrhea and constipation.  Genitourinary: Negative  for dysuria.  Musculoskeletal: Negative for myalgias, arthralgias and gait problem.  Skin: Positive for color change, rash and wound.  Neurological: Positive for headaches. Negative for dizziness, weakness and numbness.       Headache yesterday, resolved  Psychiatric/Behavioral: Negative for confusion.    Allergies  Review of patient's allergies indicates no known allergies.  Home Medications   Current Outpatient Rx  Name  Route  Sig  Dispense  Refill  . doxycycline (VIBRAMYCIN) 100 MG capsule   Oral   Take 1 capsule (100 mg total) by mouth 2 (two) times daily.   28 capsule   0     BP 120/78  Pulse 84  Temp(Src) 98 F (36.7 C) (Oral)  Resp 18  SpO2 98%  Physical Exam  Nursing note and vitals reviewed. Constitutional: He appears well-developed and well-nourished. No distress.  HENT:  Head: Normocephalic and atraumatic.  Eyes: Conjunctivae are normal.  Neck: Neck supple.  Pulmonary/Chest: Effort normal.  Neurological: He is alert. GCS eye subscore is 4. GCS verbal subscore is 5. GCS motor subscore is 6.  CN II-XII intact, EOMs intact, no pronator drift, grip strengths equal bilaterally; strength 5/5 in all extremities, sensation intact in all extremities; finger to nose, heel to shin, rapid alternating movements normal; gait is normal.     Skin: He is not diaphoretic.     Psychiatric: He has a normal mood and affect. His behavior is normal.  ED Course  Procedures (including critical care time)  Labs Reviewed - No data to display No results found.   1. Erythema migrans (Lyme disease)     MDM  Pt with tick attached near left axilla, removed approximately 6 days ago, now with erythema migrans.  No systemic symptoms.  No arthralgias, no neurologic symptoms or findings on exam.  Pt d/c home with doxycycline.  Discussed findings, treatment, follow up with patient.  Pt given return precautions.  Pt verbalizes understanding and agrees with plan.            Trixie Dredge, PA-C 12/03/12 1524

## 2012-12-03 NOTE — ED Notes (Signed)
Pt's wife pulled a tick off on Saturday from below left axilla. Wife is concerned about the wound. Small scabbed wound with evidence of pt scratching it.

## 2012-12-05 NOTE — ED Provider Notes (Signed)
Medical screening examination/treatment/procedure(s) were performed by non-physician practitioner and as supervising physician I was immediately available for consultation/collaboration.    Argusta Mcgann D Priyal Musquiz, MD 12/05/12 0920 

## 2012-12-16 ENCOUNTER — Encounter (HOSPITAL_COMMUNITY): Payer: Self-pay | Admitting: Adult Health

## 2012-12-16 ENCOUNTER — Emergency Department (HOSPITAL_COMMUNITY)
Admission: EM | Admit: 2012-12-16 | Discharge: 2012-12-16 | Disposition: A | Discharge status: Home / Self Care | Payer: Self-pay | Attending: Emergency Medicine | Admitting: Emergency Medicine

## 2012-12-16 DIAGNOSIS — R21 Rash and other nonspecific skin eruption: Secondary | ICD-10-CM | POA: Insufficient documentation

## 2012-12-16 DIAGNOSIS — Z8619 Personal history of other infectious and parasitic diseases: Secondary | ICD-10-CM | POA: Insufficient documentation

## 2012-12-16 DIAGNOSIS — Z8701 Personal history of pneumonia (recurrent): Secondary | ICD-10-CM | POA: Insufficient documentation

## 2012-12-16 DIAGNOSIS — R112 Nausea with vomiting, unspecified: Secondary | ICD-10-CM | POA: Insufficient documentation

## 2012-12-16 DIAGNOSIS — R109 Unspecified abdominal pain: Secondary | ICD-10-CM | POA: Insufficient documentation

## 2012-12-16 DIAGNOSIS — Z8739 Personal history of other diseases of the musculoskeletal system and connective tissue: Secondary | ICD-10-CM | POA: Insufficient documentation

## 2012-12-16 DIAGNOSIS — R55 Syncope and collapse: Secondary | ICD-10-CM | POA: Insufficient documentation

## 2012-12-16 DIAGNOSIS — Z87891 Personal history of nicotine dependence: Secondary | ICD-10-CM | POA: Insufficient documentation

## 2012-12-16 DIAGNOSIS — Z8679 Personal history of other diseases of the circulatory system: Secondary | ICD-10-CM | POA: Insufficient documentation

## 2012-12-16 DIAGNOSIS — Z8659 Personal history of other mental and behavioral disorders: Secondary | ICD-10-CM | POA: Insufficient documentation

## 2012-12-16 HISTORY — DX: Lyme disease, unspecified: A69.20

## 2012-12-16 LAB — LIPASE, BLOOD: Lipase: 41 U/L (ref 11–59)

## 2012-12-16 LAB — CBC
HCT: 41.7 % (ref 39.0–52.0)
MCHC: 35.3 g/dL (ref 30.0–36.0)
RDW: 13 % (ref 11.5–15.5)

## 2012-12-16 LAB — BASIC METABOLIC PANEL
BUN: 14 mg/dL (ref 6–23)
GFR calc Af Amer: 81 mL/min — ABNORMAL LOW (ref >90)
GFR calc non Af Amer: 70 mL/min — ABNORMAL LOW (ref >90)
Potassium: 4.2 mEq/L (ref 3.5–5.1)
Sodium: 137 mEq/L (ref 135–145)

## 2012-12-16 LAB — HEPATIC FUNCTION PANEL
ALT: 13 U/L (ref 0–53)
Bilirubin, Direct: <0.1 mg/dL (ref 0.0–0.3)

## 2012-12-16 MED ORDER — ONDANSETRON 8 MG PO TBDP
8.0000 mg | ORAL_TABLET | Freq: Three times a day (TID) | ORAL | Status: DC | PRN
Start: 2012-12-16 — End: 2017-06-22

## 2012-12-16 MED ORDER — ONDANSETRON 4 MG PO TBDP
4.0000 mg | ORAL_TABLET | Freq: Once | ORAL | Status: AC
  Administered 2012-12-16: 4 mg via ORAL
  Filled 2012-12-16: qty 1

## 2012-12-16 NOTE — ED Notes (Addendum)
Pt reports, recent Dx of Lyme Disease 10 days ago and has been taking Doxcycline. Yesterday while at work pt reports severe abdominal pain in center of stomach at umbilicus described as cramping that began at 1 pm, pt reports going to the break room to get some ice and talking with a coworker and then waking up on the floor, he does not remember incident, reports loss of consciousness for approx 5 seconds, waking up feeling disoriented and sleepy. Throughout the day reports intermittent stomach cramps and inability to have a BM. Today the stomach cramps are intermittent but not as severe. Pt is alert and oriented, MAEx4. Reclined position and having a BM makes pain better, pain is worse after eating, drinking and taking the Doxycycline. Denies headache, neurologically intact.

## 2012-12-16 NOTE — ED Provider Notes (Signed)
History     CSN: [id number]  Arrival date & time 12/16/12  1549   First MD Initiated Contact with Patient 12/16/12 1829      Chief Complaint  Patient presents with  . Loss of Consciousness    (Consider location/radiation/quality/duration/timing/severity/associated sxs/prior treatment) Patient is a 47 y.o. male presenting with syncope.  Loss of Consciousness Associated symptoms: nausea and vomiting   Associated symptoms: no chest pain, no headaches, no shortness of breath and no weakness    patient states that he had a syncopal episode yesterday. Patient states he been having upper abdominal pain it comes and goes. He states he's had nausea and has vomited. No fevers. No cough. He states that the abdominal pain gets worse after taking doxycycline. His been on doxycycline for 12 days for Lyme disease. No previous cardiac disease. He states his abdomen pain comes and goes. No headache. Confusion. No numbness or weakness.  Past Medical History  Diagnosis Date  . Pneumonia 1995  . Chest pain     "not heart related"  . Osgood-Schlatter's disease   . Migraines 10/30/11    "2 in the last 5 years"  . History of ADHD     "as a child"  . Lyme disease     Past Surgical History  Procedure Laterality Date  . No past surgeries    . Orif ankle fracture  10/31/2011    Procedure: OPEN REDUCTION INTERNAL FIXATION (ORIF) ANKLE FRACTURE;  Surgeon: [name], MD;  Location: [facility] OR;  Service: Orthopedics;  Laterality: Right;    History reviewed. No pertinent family history.  History  Substance Use Topics  . Smoking status: Former Smoker -- 1.00 packs/day for 20 years    Types: Cigarettes    Quit date: 10/30/2011  . Smokeless tobacco: Never Used  . Alcohol Use: Yes     Comment: 10/30/11 "occasionally have a beer w/friends"      Review of Systems  Constitutional: Negative for activity change and appetite change.  HENT: Negative for neck stiffness.   Eyes: Negative for pain.   Respiratory: Negative for chest tightness and shortness of breath.   Cardiovascular: Positive for syncope. Negative for chest pain and leg swelling.  Gastrointestinal: Positive for nausea, vomiting and abdominal pain. Negative for diarrhea.  Genitourinary: Negative for flank pain.  Musculoskeletal: Negative for back pain.  Skin: Negative for rash.  Neurological: Positive for syncope. Negative for weakness, numbness and headaches.  Psychiatric/Behavioral: Negative for behavioral problems.    Allergies  Review of patient's allergies indicates no known allergies.  Home Medications   Current Outpatient Rx  Name  Route  Sig  Dispense  Refill  . doxycycline (VIBRAMYCIN) 100 MG capsule   Oral   Take 100 mg by mouth 2 (two) times daily. For 14 days; Start date 12/03/12         . Ibuprofen (IBU PO)   Oral   Take 1-2 tablets by mouth daily as needed (pain/headaches).         . ondansetron (ZOFRAN-ODT) 8 MG disintegrating tablet   Oral   Take 1 tablet (8 mg total) by mouth every 8 (eight) hours as needed for nausea.   10 tablet   0     BP 114/99  Pulse 67  Temp(Src) 97.9 F (36.6 C) (Oral)  Resp 15  Wt 166 lb (75.297 kg)  BMI 21.91 kg/m2  SpO2 98%  Physical Exam  Nursing note and vitals reviewed. Constitutional: He is oriented to person, place,  and time. He appears well-developed and well-nourished.  HENT:  Head: Normocephalic and atraumatic.  Eyes: EOM are normal. Pupils are equal, round, and reactive to light.  Neck: Normal range of motion. Neck supple.  Cardiovascular: Normal rate, regular rhythm and normal heart sounds.   No murmur heard. Pulmonary/Chest: Effort normal and breath sounds normal.  Abdominal: Soft. Bowel sounds are normal. He exhibits no distension and no mass. There is no tenderness. There is no rebound and no guarding.  Musculoskeletal: Normal range of motion. He exhibits no edema.  Neurological: He is alert and oriented to person, place, and time.  No cranial nerve deficit.  Skin: Skin is warm and dry. Rash noted.  Circular rash to left upper extremity.  Psychiatric: He has a normal mood and affect.    ED Course  Procedures (including critical care time)  Labs Reviewed  BASIC METABOLIC PANEL - Abnormal; Notable for the following:    GFR calc non Af Amer 70 (*)    GFR calc Af Amer 81 (*)    All other components within normal limits  CBC  LIPASE, BLOOD  HEPATIC FUNCTION PANEL  POCT I-STAT TROPONIN I   No results found.   1. Syncope   2. Abdominal pain     Date: 12/17/2012  Rate: 91  Rhythm: normal sinus rhythm  QRS Axis: normal  Intervals: normal  ST/T Wave abnormalities: normal  Conduction Disutrbances: none  Narrative Interpretation: unremarkable      MDM  Patient with syncopal episode. His been eating less due to 2 abdominal pain that started after taking doxycycline. EKG is reassuring. Lab work also reassuring. No clear arrhythmias a cause. We'll continue the next 2 days of antibiotics the patient will followup as needed. Patient does not have any changes on EKG that would be worrisome for arrhythmia due to Lyme disease       Juliet Rude. Rubin Payor, MD 12/17/12 1954

## 2012-12-16 NOTE — ED Notes (Signed)
Pt states understanding of discharge instructons

## 2016-03-19 ENCOUNTER — Emergency Department: Payer: Managed Care, Other (non HMO)

## 2016-03-19 ENCOUNTER — Emergency Department
Admission: EM | Admit: 2016-03-19 | Discharge: 2016-03-19 | Disposition: A | Discharge status: Home / Self Care | Payer: Managed Care, Other (non HMO) | Attending: Emergency Medicine | Admitting: Emergency Medicine

## 2016-03-19 ENCOUNTER — Encounter: Payer: Self-pay | Admitting: Emergency Medicine

## 2016-03-19 DIAGNOSIS — F909 Attention-deficit hyperactivity disorder, unspecified type: Secondary | ICD-10-CM | POA: Insufficient documentation

## 2016-03-19 DIAGNOSIS — R05 Cough: Secondary | ICD-10-CM | POA: Diagnosis present

## 2016-03-19 DIAGNOSIS — J209 Acute bronchitis, unspecified: Secondary | ICD-10-CM | POA: Diagnosis not present

## 2016-03-19 DIAGNOSIS — Z79899 Other long term (current) drug therapy: Secondary | ICD-10-CM | POA: Insufficient documentation

## 2016-03-19 DIAGNOSIS — Z87891 Personal history of nicotine dependence: Secondary | ICD-10-CM | POA: Insufficient documentation

## 2016-03-19 MED ORDER — AZITHROMYCIN 250 MG PO TABS: ORAL_TABLET | ORAL | 0 refills | Status: AC

## 2016-03-19 MED ORDER — PREDNISONE 20 MG PO TABS: ORAL_TABLET | ORAL | 0 refills | Status: AC

## 2016-03-19 MED ORDER — PROMETHAZINE-DM 6.25-15 MG/5ML PO SYRP: 5.0000 mL | ORAL_SOLUTION | Freq: Four times a day (QID) | ORAL | 0 refills | Status: AC | PRN

## 2016-03-19 NOTE — ED Notes (Signed)
Pt reports chest congestion and yellow productive cough for few days. Has had fever up to 102. Has sore throat. Has had some nausea. Has had diarrhea.

## 2016-03-19 NOTE — ED Provider Notes (Signed)
[facility] Emergency Department Provider Note  ____________________________________________  Time seen: Approximately 10:54 AM  I have reviewed the triage vital signs and the nursing notes.   HISTORY  Chief Complaint Cough and Nasal Congestion    HPI Joseph Zhang is a 50 y.o. male , NAD, presents to the emergency department accompanied by his wife who assists with history. Patient states he has had cough, chest congestion, fever and fatigue over the last 10 days. It started as a viral upper respiratory infection with runny nose, nasal congestion, cough. Has progressively worsened especially over the last 4-5 days in which he's had productive cough of yellow sputum. He states the medicine service approximately one week ago and was told to purchase Mucinex, Tylenol and ibuprofen as well as Nasonex. Patient notes that these medications have helped very little. No history of asthma or COPD. Does have a history of tobacco use. States that he has had episodes of bronchitis, walking pneumonia which has caused syncopal episodes. He denies any loss of consciousness, lightheadedness or dizziness at this time. Has had some mild headaches but no numbness, weakness, tingling. Denies chest pain, shortness of breath, wheezing, abdominal pain, nausea, vomiting.   Past Medical History:  Diagnosis Date  . Chest pain    "not heart related"  . History of ADHD    "as a child"  . Lyme disease   . Migraines 10/30/11   "2 in the last 5 years"  . Osgood-Schlatter's disease   . Pneumonia 1995    Patient Active Problem List   Diagnosis Date Noted  . CIGARETTE SMOKER 06/15/2010    Past Surgical History:  Procedure Laterality Date  . NO PAST SURGERIES    . ORIF ANKLE FRACTURE  10/31/2011   Procedure: OPEN REDUCTION INTERNAL FIXATION (ORIF) ANKLE FRACTURE;  Surgeon: Mable ParisJustin William Chandler, MD;  [facility];  Service: Orthopedics;  Laterality: Right;    Prior to Admission  medications   Medication Sig Start Date End Date Taking? Authorizing Provider  azithromycin (ZITHROMAX Z-PAK) 250 MG tablet Take 2 tablets (500 mg) on  Day 1,  followed by 1 tablet (250 mg) once daily on Days 2 through 5. 03/19/16   Analayah Brooke L Jazzmen Restivo, PA-C  doxycycline (VIBRAMYCIN) 100 MG capsule Take 100 mg by mouth 2 (two) times daily. For 14 days; Start date 12/03/12 12/03/12   Trixie DredgeEmily West, PA-C  Ibuprofen (IBU PO) Take 1-2 tablets by mouth daily as needed (pain/headaches).    Historical Provider, MD  ondansetron (ZOFRAN-ODT) 8 MG disintegrating tablet Take 1 tablet (8 mg total) by mouth every 8 (eight) hours as needed for nausea. 12/16/12   Benjiman CoreNathan Pickering, MD  predniSONE (DELTASONE) 20 MG tablet Take 2 tablets by mouth, once daily, for 5 days 03/19/16   Phelan Schadt L Jone Panebianco, PA-C  promethazine-dextromethorphan (PROMETHAZINE-DM) 6.25-15 MG/5ML syrup Take 5 mLs by mouth 4 (four) times daily as needed for cough. 03/19/16   Haaris Metallo L Orva Gwaltney, PA-C    Allergies Review of patient's allergies indicates no known allergies.  History reviewed. No pertinent family history.  Social History Social History  Substance Use Topics  . Smoking status: Former Smoker    Packs/day: 1.00    Years: 20.00    Types: Cigarettes    Quit date: 10/30/2011  . Smokeless tobacco: Never Used  . Alcohol use Yes     Comment: 10/30/11 "occasionally have a beer w/friends"     Review of Systems  Constitutional: Positive fever with MAXIMUM TEMPERATURE of 102F  that has resolved. Positive fatigue, chills. Eyes: No visual changes. No discharge, redness, pain ENT: Positive sore throat, nasal congestion, runny nose. Cardiovascular: No chest pain. Respiratory: Positive productive cough of yellow sputum, chest congestion.  No shortness of breath. No wheezing.  Gastrointestinal: No abdominal pain.  No nausea, vomiting. Musculoskeletal: Negative for general myalgias or joint swelling.  Skin: Negative for rash. Neurological: Positive for headaches,  but no focal weakness or numbness. No tingling, LOC, dizziness, lightheadedness or syncope. 10-point ROS otherwise negative.  ____________________________________________   PHYSICAL EXAM:  VITAL SIGNS: ED Triage Vitals  Enc Vitals Group     BP 03/19/16 1023 (!) 149/94     Pulse Rate 03/19/16 1023 91     Resp 03/19/16 1023 18     Temp 03/19/16 1023 97.7 F (36.5 C)     Temp Source 03/19/16 1023 Oral     SpO2 03/19/16 1023 97 %     Weight 03/19/16 1019 178 lb (80.7 kg)     Height 03/19/16 1019 6\' 1"  (1.854 m)     Head Circumference --      Peak Flow --      Pain Score 03/19/16 1021 4     Pain Loc --      Pain Edu? --      Excl. in [place]? --      Constitutional: Alert and oriented. Well appearing and in no acute distress. Eyes: Conjunctivae are normal Without icterus or injection. PERRL. EOMI without pain.  Head: Atraumatic. ENT:      Ears: TMs cannot be visualized bilaterally due to significant non-impacted cerumen in bilateral canals.      Nose: Moderate congestion with clear rhinorrhea. Bilateral turbinates injected.      Mouth/Throat: Mucous membranes are moist. Pharynx without erythema, swelling, exudate. Uvula is midline. Airways patent. Moderate clear postnasal drip. Neck: Supple with full range of motion. No meningismus Hematological/Lymphatic/Immunilogical: No cervical lymphadenopathy. Cardiovascular: Normal rate, regular rhythm. Normal S1 and S2.  Good peripheral circulation. Respiratory: Normal respiratory effort without tachypnea or retractions. Lungs CTAB with breath sounds noted in all lung fields. No wheeze, rhonchi, rales. Neurologic:  Normal speech and language. No gross focal neurologic deficits are appreciated.  Skin:  Skin is warm, dry and intact. No rash noted. Psychiatric: Mood and affect are normal. Speech and behavior are normal. Patient exhibits appropriate insight and judgement.   ____________________________________________    LABS  None ____________________________________________  EKG  None ____________________________________________  RADIOLOGY I have personally viewed and evaluated these images (plain radiographs) as part of my medical decision making, as well as reviewing the written report by the radiologist.  Dg Chest 2 View  Result Date: 03/19/2016 CLINICAL DATA:  Chest pain and fever. EXAM: CHEST  2 VIEW COMPARISON:  Chest x-ray 10/20/2001. FINDINGS: Mediastinum hilar structures normal. Lungs are clear. No pleural effusion or pneumothorax. Degenerative changes thoracic spine. IMPRESSION: No acute cardiopulmonary disease. Electronically Signed   By: Maisie Fus  Register   On: 03/19/2016 11:29    ____________________________________________    PROCEDURES  Procedure(s) performed: None   Procedures   Medications - No data to display   ____________________________________________   INITIAL IMPRESSION / ASSESSMENT AND PLAN / ED COURSE  Pertinent labs & imaging results that were available during my care of the patient were reviewed by me and considered in my medical decision making (see chart for details).  Clinical Course    Patient's diagnosis is consistent with Acute bacterial bronchitis. Patient will be discharged home with prescriptions  for azithromycin, prednisone and promethazine DM cough syrup to take as directed. May continue Tylenol as needed. Patient is to follow up with [facility] if symptoms persist past this treatment course. Patient is given ED precautions to return to the ED for any worsening or new symptoms.    ____________________________________________  FINAL CLINICAL IMPRESSION(S) / ED DIAGNOSES  Final diagnoses:  Acute bronchitis, unspecified organism      NEW MEDICATIONS STARTED DURING THIS VISIT:  New Prescriptions   AZITHROMYCIN (ZITHROMAX Z-PAK) 250 MG TABLET    Take 2 tablets (500 mg) on  Day 1,  followed by 1 tablet (250 mg) once daily on  Days 2 through 5.   PREDNISONE (DELTASONE) 20 MG TABLET    Take 2 tablets by mouth, once daily, for 5 days   PROMETHAZINE-DEXTROMETHORPHAN (PROMETHAZINE-DM) 6.25-15 MG/5ML SYRUP    Take 5 mLs by mouth 4 (four) times daily as needed for cough.         Hope Pigeon, PA-C 03/19/16 1152    Sharyn Creamer, MD 03/19/16 [phone]

## 2016-03-19 NOTE — ED Triage Notes (Signed)
Pt to ed with c/o cough, congestion, x 3 days.

## 2017-06-22 ENCOUNTER — Emergency Department
Admission: EM | Admit: 2017-06-22 | Discharge: 2017-06-22 | Disposition: A | Discharge status: Home / Self Care | Payer: 59 | Attending: Emergency Medicine | Admitting: Emergency Medicine

## 2017-06-22 ENCOUNTER — Emergency Department: Payer: 59

## 2017-06-22 ENCOUNTER — Other Ambulatory Visit: Payer: Self-pay

## 2017-06-22 ENCOUNTER — Encounter: Payer: Self-pay | Admitting: Emergency Medicine

## 2017-06-22 DIAGNOSIS — J111 Influenza due to unidentified influenza virus with other respiratory manifestations: Secondary | ICD-10-CM

## 2017-06-22 DIAGNOSIS — R05 Cough: Secondary | ICD-10-CM | POA: Diagnosis present

## 2017-06-22 DIAGNOSIS — J09X2 Influenza due to identified novel influenza A virus with other respiratory manifestations: Secondary | ICD-10-CM | POA: Insufficient documentation

## 2017-06-22 DIAGNOSIS — Z87891 Personal history of nicotine dependence: Secondary | ICD-10-CM | POA: Insufficient documentation

## 2017-06-22 LAB — INFLUENZA PANEL BY PCR (TYPE A & B)
Influenza A By PCR: POSITIVE — AB
Influenza B By PCR: NEGATIVE

## 2017-06-22 MED ORDER — ACETAMINOPHEN 325 MG PO TABS
650.0000 mg | ORAL_TABLET | Freq: Once | ORAL | Status: AC | PRN
  Administered 2017-06-22: 650 mg via ORAL
  Filled 2017-06-22: qty 2

## 2017-06-22 MED ORDER — FLUTICASONE PROPIONATE 50 MCG/ACT NA SUSP: 1.0000 | Freq: Two times a day (BID) | NASAL | 0 refills | Status: AC

## 2017-06-22 MED ORDER — ONDANSETRON 4 MG PO TBDP: 4.0000 mg | ORAL_TABLET | Freq: Three times a day (TID) | ORAL | 0 refills | Status: AC | PRN

## 2017-06-22 MED ORDER — BENZONATATE 100 MG PO CAPS: 100.0000 mg | ORAL_CAPSULE | Freq: Four times a day (QID) | ORAL | 0 refills | Status: AC | PRN

## 2017-06-22 NOTE — ED Notes (Signed)
First nurse note   Presents with cough,chest discomfort ,wheezing and chills  Sx's started yesterday

## 2017-06-22 NOTE — ED Provider Notes (Signed)
[facility] Emergency Department Provider Note  ____________________________________________  Time seen: Approximately 5:21 PM  I have reviewed the triage vital signs and the nursing notes.   HISTORY  Chief Complaint flu like symptoms    HPI Joseph Zhang is a 51 y.o. male who presents the emergency department complaining of sudden onset of fever, chills, body aches, nausea, nasal congestion, coughing.  Patient reports that he was on a recent cruise, felt well until yesterday.  Patient reports that he can remember the exact moment he started feeling bad.  Patient has tried Advil, Mucinex with minimal relief.  Patient denies any headache, neck stiffness or pain, frank abdominal pain, diarrhea or constipation at this time.  No emesis just nausea.  No other complaints at this time.  Past Medical History:  Diagnosis Date  . Chest pain    "not heart related"  . History of ADHD    "as a child"  . Lyme disease   . Migraines 10/30/11   "2 in the last 5 years"  . Osgood-Schlatter's disease   . Pneumonia 1995    Patient Active Problem List   Diagnosis Date Noted  . CIGARETTE SMOKER 06/15/2010    Past Surgical History:  Procedure Laterality Date  . NO PAST SURGERIES    . ORIF ANKLE FRACTURE  10/31/2011   Procedure: OPEN REDUCTION INTERNAL FIXATION (ORIF) ANKLE FRACTURE;  Surgeon: Nita Sells, MD;  Location: [place];  Service: Orthopedics;  Laterality: Right;    Prior to Admission medications   Medication Sig Start Date End Date Taking? Authorizing Provider  azithromycin (ZITHROMAX Z-PAK) 250 MG tablet Take 2 tablets (500 mg) on  Day 1,  followed by 1 tablet (250 mg) once daily on Days 2 through 5. 03/19/16   Hagler, Jami L, PA-C  benzonatate (TESSALON PERLES) 100 MG capsule Take 1 capsule (100 mg total) by mouth every 6 (six) hours as needed for cough. 06/22/17 06/22/18  Keali Mccraw, Charline Bills, PA-C  doxycycline (VIBRAMYCIN) 100 MG capsule Take 100  mg by mouth 2 (two) times daily. For 14 days; Start date 12/03/12 12/03/12   Clayton Bibles, PA-C  fluticasone [facility]) 50 MCG/ACT nasal spray Place 1 spray into both nostrils 2 (two) times daily. 06/22/17   Esma Kilts, Charline Bills, PA-C  Ibuprofen (IBU PO) Take 1-2 tablets by mouth daily as needed (pain/headaches).    [provider]  ondansetron (ZOFRAN-ODT) 4 MG disintegrating tablet Take 1 tablet (4 mg total) by mouth every 8 (eight) hours as needed for nausea or vomiting. 06/22/17   Leighanna Kirn, Charline Bills, PA-C  predniSONE (DELTASONE) 20 MG tablet Take 2 tablets by mouth, once daily, for 5 days 03/19/16   Hagler, Jami L, PA-C  promethazine-dextromethorphan (PROMETHAZINE-DM) 6.25-15 MG/5ML syrup Take 5 mLs by mouth 4 (four) times daily as needed for cough. 03/19/16   Hagler, Jami L, PA-C    Allergies Patient has no known allergies.  History reviewed. No pertinent family history.  Social History Social History   Tobacco Use  . Smoking status: Former Smoker    Packs/day: 1.00    Years: 20.00    Pack years: 20.00    Types: Cigarettes    Last attempt to quit: 10/30/2011    Years since quitting: 5.6  . Smokeless tobacco: Never Used  Substance Use Topics  . Alcohol use: Yes    Comment: 10/30/11 "occasionally have a beer w/friends"  . Drug use: No     Review of Systems  Constitutional: Positive fever/chills  Eyes: No visual changes. No discharge ENT: Positive for nasal congestion Cardiovascular: no chest pain. Respiratory: Positive cough. No SOB. Gastrointestinal: No abdominal pain.  Positive for nausea but no vomiting.  No diarrhea.  No constipation. Musculoskeletal: Negative for musculoskeletal pain. Skin: Negative for rash, abrasions, lacerations, ecchymosis. Neurological: Negative for headaches, focal weakness or numbness. 10-point ROS otherwise negative.  ____________________________________________   PHYSICAL EXAM:  VITAL SIGNS: ED Triage Vitals  Enc Vitals Group      BP 06/22/17 1639 (!) 156/84     Pulse Rate 06/22/17 1639 (!) 123     Resp 06/22/17 1639 (!) 22     Temp 06/22/17 1639 (!) 101.2 F (38.4 C)     Temp Source 06/22/17 1639 Oral     SpO2 06/22/17 1639 94 %     Weight 06/22/17 1637 189 lb (85.7 kg)     Height 06/22/17 1637 _0  (1.93 m)     Head Circumference --      Peak Flow --      Pain Score --      Pain Loc --      Pain Edu? --      Excl. in [place]? --      Constitutional: Alert and oriented.  Moderately ill appearing but in no acute distress. Eyes: Conjunctivae are normal. PERRL. EOMI. Head: Atraumatic. ENT:      Ears: EACs with significant cerumen bilaterally.  TMs are not completely visualized but R are normal appearance, no bulging, no air-fluid level.      Nose: Mild to moderate clear congestion/rhinnorhea.      Mouth/Throat: Mucous membranes are moist.  Pharynx is mildly erythematous but nonedematous.  Uvula is midline. Neck: No stridor.  Neck is supple full range of motion Hematological/Lymphatic/Immunilogical: Diffuse, mobile, nontender anterior cervical lymphadenopathy. Cardiovascular: Normal rate, regular rhythm. Normal S1 and S2.  Good peripheral circulation. Respiratory: Normal respiratory effort without tachypnea or retractions. Lungs CTAB. Good air entry to the bases with no decreased or absent breath sounds. Gastrointestinal: Bowel sounds 4 quadrants. Soft and nontender to palpation. No guarding or rigidity. No palpable masses. No distention. No CVA tenderness. Musculoskeletal: Full range of motion to all extremities. No gross deformities appreciated. Neurologic:  Normal speech and language. No gross focal neurologic deficits are appreciated.  Skin:  Skin is warm, dry and intact. No rash noted. Psychiatric: Mood and affect are normal. Speech and behavior are normal. Patient exhibits appropriate insight and judgement.   ____________________________________________   LABS (all labs ordered are listed, but only  abnormal results are displayed)  Labs Reviewed  INFLUENZA PANEL BY PCR (TYPE A & B) - Abnormal; Notable for the following components:      Result Value   Influenza A By PCR POSITIVE (*)    All other components within normal limits   ____________________________________________  EKG   ____________________________________________  RADIOLOGY Diamantina Providence Meilah Delrosario, personally viewed and evaluated these images (plain radiographs) as part of my medical decision making, as well as reviewing the written report by the radiologist.  Dg Chest 2 View  Result Date: 06/22/2017 CLINICAL DATA:  Cough, headache, fever, and some nausea. Just got off cruise ship yesterday. Fever in triage here. Hx - PNA 1995, current smoker 1 ppd EXAM: CHEST  2 VIEW COMPARISON:  03/19/2016 FINDINGS: The heart size and mediastinal contours are within normal limits. Both lungs are clear. The visualized skeletal structures are unremarkable. IMPRESSION: No active cardiopulmonary disease. Electronically Signed   By: Van Clines  M.D.   On: 06/22/2017 17:13    ____________________________________________    PROCEDURES  Procedure(s) performed:    Procedures    Medications  acetaminophen (TYLENOL) tablet 650 mg (650 mg Oral Given 06/22/17 1652)     ____________________________________________   INITIAL IMPRESSION / ASSESSMENT AND PLAN / ED COURSE  Pertinent labs & imaging results that were available during my care of the patient were reviewed by me and considered in my medical decision making (see chart for details).  Review of the [place] CSRS was performed in accordance of the [place] prior to dispensing any controlled drugs.     Patient's diagnosis is consistent with influenza. Initial differential included viral URI, influenza, bronchitis, pneumonia, Norovirus.  Patient was diagnosed prior to influenza turning.  Patient symptoms met influenza and his diagnosis same.  After lengthy discussion, patient  declines Tamiflu but does accept prescriptions for cough medication, Flonase, nausea medication.  Patient is to follow up with primary care as needed or otherwise directed. Patient is given ED precautions to return to the ED for any worsening or new symptoms.     ____________________________________________  FINAL CLINICAL IMPRESSION(S) / ED DIAGNOSES  Final diagnoses:  Influenza      NEW MEDICATIONS STARTED DURING THIS VISIT:  ED Discharge Orders        Ordered    benzonatate (TESSALON PERLES) 100 MG capsule  Every 6 hours PRN     06/22/17 1751    ondansetron (ZOFRAN-ODT) 4 MG disintegrating tablet  Every 8 hours PRN     06/22/17 1751    fluticasone (FLONASE) 50 MCG/ACT nasal spray  2 times daily     06/22/17 1751          This chart was dictated using voice recognition software/Dragon. Despite best efforts to proofread, errors can occur which can change the meaning. Any change was purely unintentional.    Darletta Moll, PA-C 06/22/17 Reinaldo Berber, MD 06/23/17 [phone]

## 2017-06-22 NOTE — ED Triage Notes (Signed)
Pt here for cough, headache, fever, and some nausea. Just got off cruise ship yesterday and unsure if exposed to flu or anything else. Wife reports she listened and he had wheezing earlier and that she had him do her peak flow and he got 310 and that her thermometer is broke but she would guess his temp has been 102.  Pt ambulatory.  VSS.  Has not had any tylenol or motrin today.  Reports had PNA one time in the last 20 years and that not sure if that the flu or bronchitis.

## 2017-06-22 NOTE — ED Notes (Signed)
Patient c/o nasal congestion, fever, chills, Nausea for 1 day.

## 2018-07-26 ENCOUNTER — Emergency Department
Admission: EM | Admit: 2018-07-26 | Discharge: 2018-07-26 | Disposition: A | Discharge status: Home / Self Care | Payer: BLUE CROSS/BLUE SHIELD | Attending: Emergency Medicine | Admitting: Emergency Medicine

## 2018-07-26 ENCOUNTER — Other Ambulatory Visit: Payer: Self-pay

## 2018-07-26 ENCOUNTER — Emergency Department: Payer: BLUE CROSS/BLUE SHIELD

## 2018-07-26 DIAGNOSIS — R0789 Other chest pain: Secondary | ICD-10-CM | POA: Insufficient documentation

## 2018-07-26 DIAGNOSIS — Z5321 Procedure and treatment not carried out due to patient leaving prior to being seen by health care provider: Secondary | ICD-10-CM | POA: Diagnosis not present

## 2018-07-26 LAB — CBC
HEMATOCRIT: 41.5 % (ref 39.0–52.0)
Hemoglobin: 14.1 g/dL (ref 13.0–17.0)
MCH: 30.5 pg (ref 26.0–34.0)
MCHC: 34 g/dL (ref 30.0–36.0)
MCV: 89.6 fL (ref 80.0–100.0)
Platelets: 239 10*3/uL (ref 150–400)
RBC: 4.63 MIL/uL (ref 4.22–5.81)
RDW: 13.2 % (ref 11.5–15.5)
WBC: 7.1 10*3/uL (ref 4.0–10.5)
nRBC: 0 % (ref 0.0–0.2)

## 2018-07-26 LAB — BASIC METABOLIC PANEL
ANION GAP: 6 (ref 5–15)
BUN: 14 mg/dL (ref 6–20)
CHLORIDE: 106 mmol/L (ref 98–111)
CO2: 26 mmol/L (ref 22–32)
Calcium: 9 mg/dL (ref 8.9–10.3)
Creatinine, Ser: 1.37 mg/dL — ABNORMAL HIGH (ref 0.61–1.24)
GFR, EST NON AFRICAN AMERICAN: 60 mL/min — AB (ref >60)
Glucose, Bld: 108 mg/dL — ABNORMAL HIGH (ref 70–99)
POTASSIUM: 4.2 mmol/L (ref 3.5–5.1)
Sodium: 138 mmol/L (ref 135–145)

## 2018-07-26 LAB — TROPONIN I

## 2018-07-26 MED ORDER — SODIUM CHLORIDE 0.9% FLUSH: 3.0000 mL | Freq: Once | INTRAVENOUS | Status: DC

## 2018-07-26 NOTE — ED Triage Notes (Signed)
PT TO THE ER FOR CHEST PAIN SINCE MIDNIGHT. PT STATES THE PAIN IS IN HIS BACK AND WRAPPED AROUND TO CHEST AT THE LEFT BREAST. PT DENIES OTHER SYMPTOMS. PT STATES IT FEELS LIKE A PULLED MUSCLE. PT DENIES HX OF HEART ATTACK.

## 2018-07-27 ENCOUNTER — Telehealth: Payer: Self-pay | Admitting: Emergency Medicine

## 2018-07-27 NOTE — Telephone Encounter (Signed)
Called patient due to lwot to inquire about condition and follow up plans. Says he is making appt with pcp and they are going tocall him back.  He will return if chest pain returns prior to seeing pcp.

## 2020-05-18 IMAGING — CR DG CHEST 2V
1 series · 2 of 2 positions shown · non-contrast
Comparison: 06/22/2017

CLINICAL DATA: Chest pain since midnight

EXAM:
CHEST - 2 VIEW

[Series 1: dg chest 2 view · 0.14mm/px · 2 of 2 slices shown]
[im 1/2]
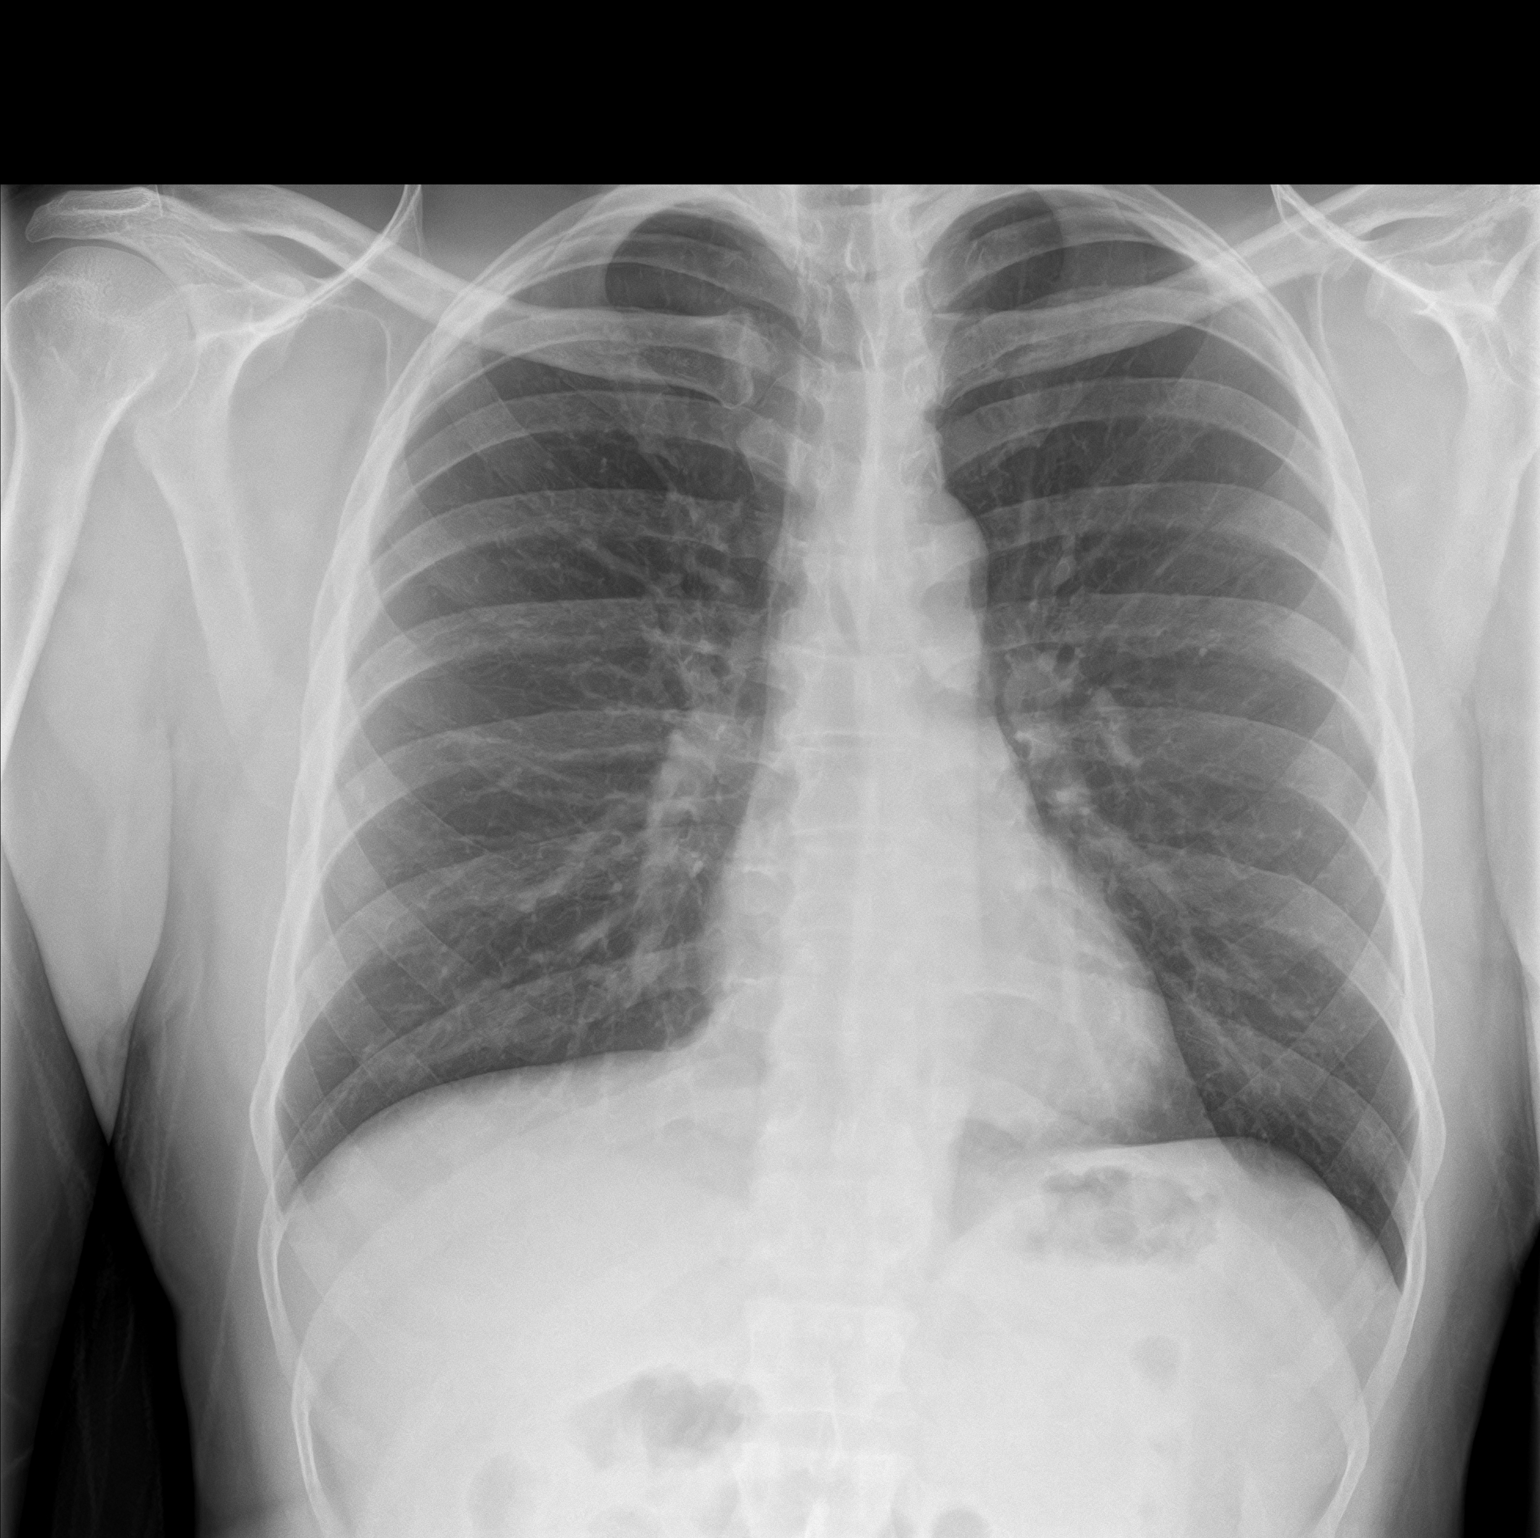
[im 2/2]
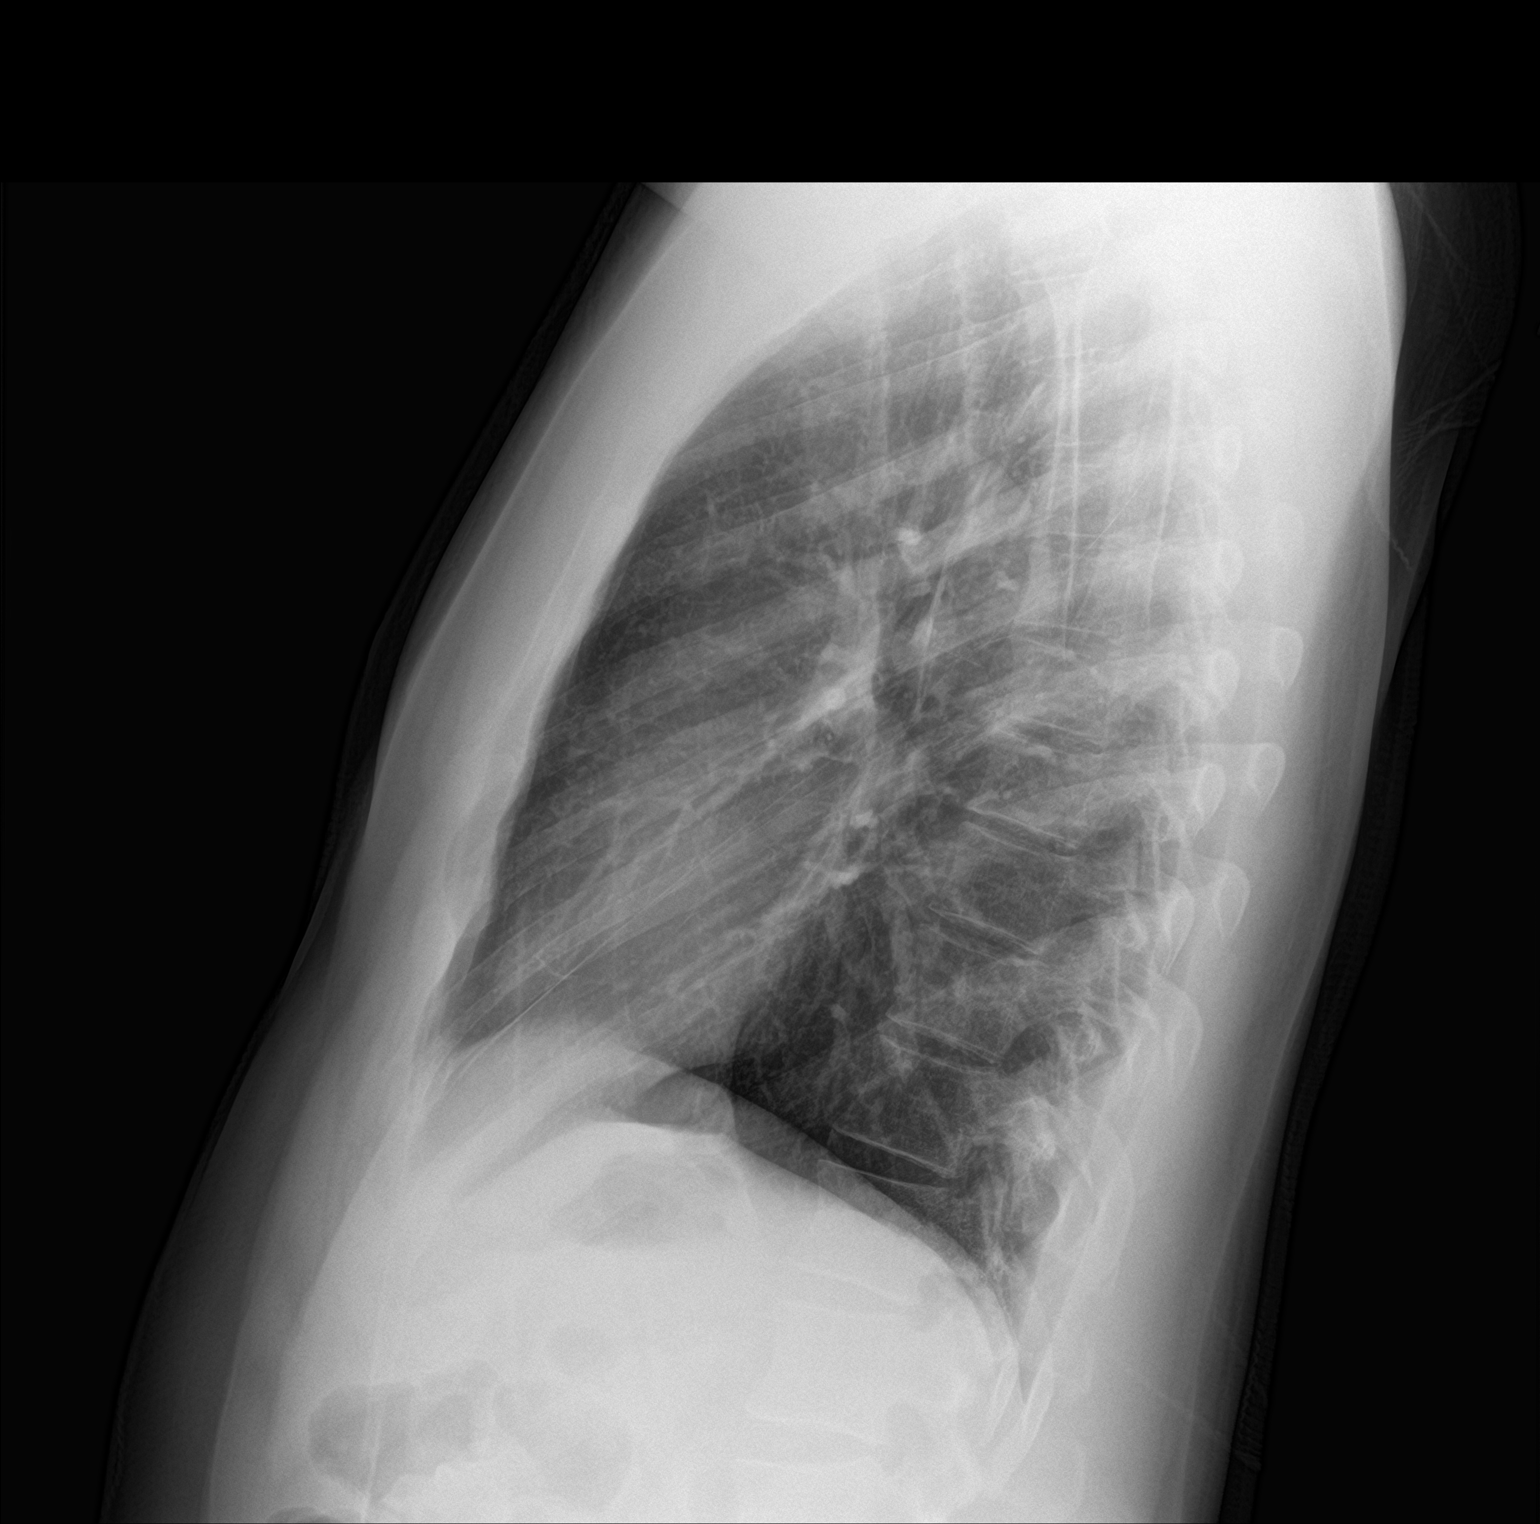

[2 of 2 positions shown; findings below may reference images not displayed]

FINDINGS: The heart size and mediastinal contours are within normal limits.
Both lungs are clear. The visualized skeletal structures are
unremarkable.
IMPRESSION: No active cardiopulmonary disease.
